# Patient Record
Sex: Female | Born: 1995
Health system: Southern US, Community
[De-identification: ages and names within clinical notes are randomized; demographics above are authoritative.]

## PROBLEM LIST (undated history)

## (undated) DIAGNOSIS — Z789 Other specified health status: Secondary | ICD-10-CM

## (undated) HISTORY — DX: Other specified health status: Z78.9

## (undated) HISTORY — PX: NO PAST SURGERIES: SHX2092

---

## 2020-02-07 ENCOUNTER — Ambulatory Visit: Payer: Self-pay | Attending: Internal Medicine

## 2020-02-07 DIAGNOSIS — Z23 Encounter for immunization: Secondary | ICD-10-CM | POA: Insufficient documentation

## 2020-02-07 NOTE — Progress Notes (Signed)
   Covid-19 Vaccination Clinic  Name:  Bushra Denman    MRN: [id number] DOB: [birth year]  02/07/2020  [name] was observed post Covid-19 immunization for 15 minutes without incidence. She was provided with Vaccine Information Sheet and instruction to access the V-Safe system.   Ms. Runion was instructed to call 911 with any severe reactions post vaccine: Marland Kitchen Difficulty breathing  . Swelling of your face and throat  . A fast heartbeat  . A bad rash all over your body  . Dizziness and weakness    Immunizations Administered    Name Date Dose VIS Date Route   Pfizer COVID-19 Vaccine 02/07/2020 12:04 PM 0.3 mL 11/29/2019 Intramuscular   Manufacturer: [employer], [employer]   Lot: [id number]   NDC: [address]-6

## 2020-03-03 ENCOUNTER — Ambulatory Visit: Payer: Self-pay | Attending: Internal Medicine

## 2020-03-03 DIAGNOSIS — Z23 Encounter for immunization: Secondary | ICD-10-CM

## 2020-03-03 NOTE — Progress Notes (Signed)
   Covid-19 Vaccination Clinic  Name:  Vicki Cline    MRN: [id number] DOB: 01/30/1996  03/03/2020  [name] was observed post Covid-19 immunization for 15 minutes without incident. She was provided with Vaccine Information Sheet and instruction to access the V-Safe system.   [name] was instructed to call 911 with any severe reactions post vaccine: Marland Kitchen Difficulty breathing  . Swelling of face and throat  . A fast heartbeat  . A bad rash all over body  . Dizziness and weakness   Immunizations Administered    Name Date Dose VIS Date Route   Pfizer COVID-19 Vaccine 03/03/2020  9:25 AM 0.3 mL 11/29/2019 Intramuscular   Manufacturer: [employer], [employer]   Lot: [id number]   NDC: [address]-1

## 2020-09-17 ENCOUNTER — Emergency Department (HOSPITAL_COMMUNITY): Payer: 59

## 2020-09-17 ENCOUNTER — Other Ambulatory Visit: Payer: Self-pay

## 2020-09-17 ENCOUNTER — Emergency Department (HOSPITAL_COMMUNITY)
Admission: EM | Admit: 2020-09-17 | Discharge: 2020-09-17 | Disposition: A | Discharge status: Home / Self Care | Payer: 59 | Attending: Emergency Medicine | Admitting: Emergency Medicine

## 2020-09-17 ENCOUNTER — Encounter (HOSPITAL_COMMUNITY): Payer: Self-pay

## 2020-09-17 DIAGNOSIS — R300 Dysuria: Secondary | ICD-10-CM | POA: Diagnosis not present

## 2020-09-17 DIAGNOSIS — R1030 Lower abdominal pain, unspecified: Secondary | ICD-10-CM

## 2020-09-17 DIAGNOSIS — R110 Nausea: Secondary | ICD-10-CM | POA: Insufficient documentation

## 2020-09-17 DIAGNOSIS — N898 Other specified noninflammatory disorders of vagina: Secondary | ICD-10-CM | POA: Diagnosis not present

## 2020-09-17 DIAGNOSIS — R102 Pelvic and perineal pain: Secondary | ICD-10-CM | POA: Insufficient documentation

## 2020-09-17 DIAGNOSIS — R1031 Right lower quadrant pain: Secondary | ICD-10-CM | POA: Insufficient documentation

## 2020-09-17 LAB — CBC WITH DIFFERENTIAL/PLATELET
Abs Immature Granulocytes: 0.02 10*3/uL (ref 0.00–0.07)
Basophils Absolute: 0 10*3/uL (ref 0.0–0.1)
Basophils Relative: 1 %
Eosinophils Absolute: 0.1 10*3/uL (ref 0.0–0.5)
Eosinophils Relative: 1 %
HCT: 36.9 % (ref 36.0–46.0)
Hemoglobin: 12.1 g/dL (ref 12.0–15.0)
Immature Granulocytes: 0 %
Lymphocytes Relative: 23 %
Lymphs Abs: 1.2 10*3/uL (ref 0.7–4.0)
MCH: 29.3 pg (ref 26.0–34.0)
MCHC: 32.8 g/dL (ref 30.0–36.0)
MCV: 89.3 fL (ref 80.0–100.0)
Monocytes Absolute: 0.5 10*3/uL (ref 0.1–1.0)
Monocytes Relative: 9 %
Neutro Abs: 3.4 10*3/uL (ref 1.7–7.7)
Neutrophils Relative %: 66 %
Platelets: 272 10*3/uL (ref 150–400)
RBC: 4.13 MIL/uL (ref 3.87–5.11)
RDW: 12.4 % (ref 11.5–15.5)
WBC: 5.1 10*3/uL (ref 4.0–10.5)
nRBC: 0 % (ref 0.0–0.2)

## 2020-09-17 LAB — COMPREHENSIVE METABOLIC PANEL
ALT: 11 U/L (ref 0–44)
AST: 17 U/L (ref 15–41)
Albumin: 3.9 g/dL (ref 3.5–5.0)
Alkaline Phosphatase: 60 U/L (ref 38–126)
Anion gap: 9 (ref 5–15)
BUN: 9 mg/dL (ref 6–20)
CO2: 26 mmol/L (ref 22–32)
Calcium: 9.3 mg/dL (ref 8.9–10.3)
Chloride: 103 mmol/L (ref 98–111)
Creatinine, Ser: 0.77 mg/dL (ref 0.44–1.00)
GFR calc Af Amer: >60 mL/min (ref >60)
GFR calc non Af Amer: >60 mL/min (ref >60)
Glucose, Bld: 84 mg/dL (ref 70–99)
Potassium: 3.7 mmol/L (ref 3.5–5.1)
Sodium: 138 mmol/L (ref 135–145)
Total Bilirubin: 0.6 mg/dL (ref 0.3–1.2)
Total Protein: 7.4 g/dL (ref 6.5–8.1)

## 2020-09-17 LAB — URINALYSIS, ROUTINE W REFLEX MICROSCOPIC
Bilirubin Urine: NEGATIVE
Glucose, UA: NEGATIVE mg/dL
Ketones, ur: 20 mg/dL — AB
Leukocytes,Ua: NEGATIVE
Nitrite: NEGATIVE
Protein, ur: NEGATIVE mg/dL
Specific Gravity, Urine: 1.024 (ref 1.005–1.030)
pH: 5 (ref 5.0–8.0)

## 2020-09-17 LAB — I-STAT BETA HCG BLOOD, ED (MC, WL, AP ONLY): I-stat hCG, quantitative: <5 m[IU]/mL (ref <5)

## 2020-09-17 LAB — LIPASE, BLOOD: Lipase: 27 U/L (ref 11–51)

## 2020-09-17 MED ORDER — MORPHINE SULFATE (PF) 4 MG/ML IV SOLN
4.0000 mg | Freq: Once | INTRAVENOUS | Status: DC
  Filled 2020-09-17: qty 1

## 2020-09-17 MED ORDER — SODIUM CHLORIDE 0.9 % IV BOLUS
500.0000 mL | Freq: Once | INTRAVENOUS | Status: AC
  Administered 2020-09-17: 500 mL via INTRAVENOUS

## 2020-09-17 MED ORDER — ACETAMINOPHEN 325 MG PO TABS
650.0000 mg | ORAL_TABLET | Freq: Once | ORAL | Status: AC
  Administered 2020-09-17: 650 mg via ORAL
  Filled 2020-09-17: qty 2

## 2020-09-17 NOTE — Discharge Instructions (Addendum)
At this time there does not appear to be the presence of an emergent medical condition, however there is always the potential for conditions to change. Please read and follow the below instructions.  Please return to the Emergency Department immediately for any new or worsening symptoms. Please be sure to follow up with your Primary Care Provider within one week regarding your visit today; please call their office to schedule an appointment even if you are feeling better for a follow-up visit. Your ultrasound today showed a complex left ovarian cyst as well as some free pelvic fluid.  Please follow-up with your OB/GYN and your primary care doctor this week to discuss those results and to see if any follow-up imaging will be needed.  If you do not have an OB/GYN please call the women's health center under discharge paperwork to establish one. As we discussed her examination was limited today as he did not want pelvic examination.  Please be sure to follow-up with your OB/GYN for a pelvic exam or return to the ER for any new or worsening symptoms.  Go to the nearest Emergency Department immediately if: You have fever or chills You vomit. Your pain is only in areas of your belly, such as the right side or the left lower part of the belly. You have bloody or black poop, or poop that looks like tar. You have very bad pain, cramping, or bloating in your belly. You have signs of not having enough fluid or water in your body (dehydration), such as: Dark pee, very little pee, or no pee. Cracked lips. Dry mouth. Sunken eyes. Sleepiness. Weakness. You have trouble breathing or chest pain. You have any new/concerning or worsening of symptoms   Please read the additional information packets attached to your discharge summary.  Do not take your medicine if  develop an itchy rash, swelling in your mouth or lips, or difficulty breathing; call 911 and seek immediate emergency medical attention if this  occurs.  You may review your lab tests and imaging results in their entirety on your MyChart account.  Please discuss all results of fully with your primary care provider and other specialist at your follow-up visit.  Note: Portions of this text may have been transcribed using voice recognition software. Every effort was made to ensure accuracy; however, inadvertent computerized transcription errors may still be present.

## 2020-09-17 NOTE — ED Triage Notes (Signed)
Patient c/o right flank pain,  mid lower abdominal pain and dysuria x 2-3 days. Patient states she went to an UC and "everything came out clear."

## 2020-09-17 NOTE — ED Provider Notes (Signed)
[facility]-EMERGENCY DEPT Provider Note   CSN: [id number] Arrival date & time: 09/17/20  2130     History Chief Complaint  Patient presents with  . Flank Pain  . Dysuria    Vicki Cline is a 24 y.o. female otherwise healthy no daily medication use.  Patient presents today for abdominal pain.  She reports around a week ago she developed some vaginal discharge and pelvic pain, worsening 3 days ago intermittent crampy sensation constant nonradiating no clear aggravating or alleviating factors.  She reports she had some mild dysuria today her symptoms began.  She reports she went to an urgent care shortly after symptom onset and at that time she had a clear urinalysis.  She reports they also did a pelvic examination which showed bacterial vaginosis but her STI testing was negative and she denies any pain during her pelvic exam.  She went home and pain worsened since last night.  She reports pain is now radiates in the right lower quadrant and to her back, constant moderate intensity no clear aggravating or alleviating factors.  Associated symptom nausea.  Denies fever/chills, headache, chest pain/shortness of breath, vomiting, diarrhea, hematuria, vaginal bleeding/discharge, concern for STI or any additional concerns.  HPI     History reviewed. No pertinent past medical history.  There are no problems to display for this patient.   History reviewed. No pertinent surgical history.   OB History   No obstetric history on file.     Family History  Family history unknown: Yes    Social History   Tobacco Use  . Smoking status: Never Smoker  . Smokeless tobacco: Never Used  Vaping Use  . Vaping Use: Never used  Substance Use Topics  . Alcohol use: Yes  . Drug use: Never    Home Medications Prior to Admission medications   Medication Sig Start Date End Date Taking? Authorizing Provider  spironolactone (ALDACTONE) 50 MG tablet Take 50 mg by  mouth 2 (two) times daily. 09/14/20  Yes [provider]    Allergies    Patient has no known allergies.  Review of Systems   Review of Systems Ten systems are reviewed and are negative for acute change except as noted in the HPI  Physical Exam Updated Vital Signs BP 103/73   Pulse 77   Temp 98.2 F (36.8 C) (Oral)   Resp 18   Ht 5\' 5"  (1.651 m)   Wt 69.4 kg   LMP 08/20/2020   SpO2 100%   BMI 25.46 kg/m   Physical Exam Constitutional:      General: She is not in acute distress.    Appearance: Normal appearance. She is well-developed. She is not ill-appearing or diaphoretic.  HENT:     Head: Normocephalic and atraumatic.  Eyes:     General: Vision grossly intact. Gaze aligned appropriately.     Pupils: Pupils are equal, round, and reactive to light.  Neck:     Trachea: Trachea and phonation normal.  Pulmonary:     Effort: Pulmonary effort is normal. No respiratory distress.  Abdominal:     General: There is no distension.     Palpations: Abdomen is soft.     Tenderness: There is abdominal tenderness in the right lower quadrant, suprapubic area and left lower quadrant. There is no guarding or rebound.  Musculoskeletal:        General: Normal range of motion.     Cervical back: Normal range of motion.  Comments: No midline C/T/L spinal tenderness to palpation, no deformity, crepitus, or step-off noted. No sign of injury to the neck or back.  Skin:    General: Skin is warm and dry.  Neurological:     Mental Status: She is alert.     GCS: GCS eye subscore is 4. GCS verbal subscore is 5. GCS motor subscore is 6.     Comments: Speech is clear and goal oriented, follows commands Major Cranial nerves without deficit, no facial droop Moves extremities without ataxia, coordination intact  Psychiatric:        Behavior: Behavior normal.     ED Results / Procedures / Treatments   Labs (all labs ordered are listed, but only abnormal results are displayed) Labs  Reviewed  URINALYSIS, ROUTINE W REFLEX MICROSCOPIC - Abnormal; Notable for the following components:      Result Value   APPearance HAZY (*)    Hgb urine dipstick SMALL (*)    Ketones, ur 20 (*)    Bacteria, UA FEW (*)    All other components within normal limits  CBC WITH DIFFERENTIAL/PLATELET  LIPASE, BLOOD  COMPREHENSIVE METABOLIC PANEL  I-STAT BETA HCG BLOOD, ED ([facility], [facility], [facility] ONLY)    EKG None  Radiology US Transvaginal Non-OB  Result Date: 09/17/2020 CLINICAL DATA:  Pelvic pain. EXAM: TRANSABDOMINAL AND TRANSVAGINAL ULTRASOUND OF PELVIS DOPPLER ULTRASOUND OF OVARIES TECHNIQUE: Both transabdominal and transvaginal ultrasound examinations of the pelvis were performed. Transabdominal technique was performed for global imaging of the pelvis including uterus, ovaries, adnexal regions, and pelvic cul-de-sac. It was necessary to proceed with endovaginal exam following the transabdominal exam to visualize the uterus, endometrium, bilateral ovaries and bilateral adnexa. Color and duplex Doppler ultrasound was utilized to evaluate blood flow to the ovaries. COMPARISON:  None. FINDINGS: Uterus Measurements: 6.5 cm x 2.9 cm x 3.7 cm = volume: 36.8 mL. No fibroids or other mass visualized. Endometrium Thickness: 6.3 mm.  No focal abnormality visualized. Right ovary Measurements: 3.4 cm x 2.4 cm x 3.4 cm = volume: 14.7 mL. Normal appearance/no adnexal mass. Left ovary Measurements: 4.5 cm x 2.7 cm x 4.4 cm = volume: 28.4 mL. A 2.2 cm x 2.1 cm x 2.3 cm complex anechoic structure is seen within the left ovary. No flow is seen within this region on color Doppler evaluation. Pulsed Doppler evaluation of both ovaries demonstrates normal low-resistance arterial and venous waveforms. Other findings A moderate amount of pelvic free fluid is seen IMPRESSION: 1. Complex left ovarian cyst. 2. Moderate amount of pelvic free fluid. Electronically Signed   By: Aram Candela M.D.   On: 09/17/2020 16:30   US Pelvis  Complete  Result Date: 09/17/2020 CLINICAL DATA:  Pelvic pain. EXAM: TRANSABDOMINAL AND TRANSVAGINAL ULTRASOUND OF PELVIS DOPPLER ULTRASOUND OF OVARIES TECHNIQUE: Both transabdominal and transvaginal ultrasound examinations of the pelvis were performed. Transabdominal technique was performed for global imaging of the pelvis including uterus, ovaries, adnexal regions, and pelvic cul-de-sac. It was necessary to proceed with endovaginal exam following the transabdominal exam to visualize the uterus, endometrium, bilateral ovaries and bilateral adnexa. Color and duplex Doppler ultrasound was utilized to evaluate blood flow to the ovaries. COMPARISON:  None. FINDINGS: Uterus Measurements: 6.5 cm x 2.9 cm x 3.7 cm = volume: 36.8 mL. No fibroids or other mass visualized. Endometrium Thickness: 6.3 mm.  No focal abnormality visualized. Right ovary Measurements: 3.4 cm x 2.4 cm x 3.4 cm = volume: 14.7 mL. Normal appearance/no adnexal mass. Left ovary Measurements: 4.5 cm  x 2.7 cm x 4.4 cm = volume: 28.4 mL. A 2.2 cm x 2.1 cm x 2.3 cm complex anechoic structure is seen within the left ovary. No flow is seen within this region on color Doppler evaluation. Pulsed Doppler evaluation of both ovaries demonstrates normal low-resistance arterial and venous waveforms. Other findings A moderate amount of pelvic free fluid is seen IMPRESSION: 1. Complex left ovarian cyst. 2. Moderate amount of pelvic free fluid. Electronically Signed   By: Aram Candelahaddeus  Houston M.D.   On: 09/17/2020 16:28   US Art/Ven Flow Abd Pelv Doppler  Result Date: 09/17/2020 CLINICAL DATA:  Pelvic pain. EXAM: TRANSABDOMINAL AND TRANSVAGINAL ULTRASOUND OF PELVIS DOPPLER ULTRASOUND OF OVARIES TECHNIQUE: Both transabdominal and transvaginal ultrasound examinations of the pelvis were performed. Transabdominal technique was performed for global imaging of the pelvis including uterus, ovaries, adnexal regions, and pelvic cul-de-sac. It was necessary to proceed with  endovaginal exam following the transabdominal exam to visualize the uterus, endometrium, bilateral ovaries and bilateral adnexa. Color and duplex Doppler ultrasound was utilized to evaluate blood flow to the ovaries. COMPARISON:  None. FINDINGS: Uterus Measurements: 6.5 cm x 2.9 cm x 3.7 cm = volume: 36.8 mL. No fibroids or other mass visualized. Endometrium Thickness: 6.3 mm.  No focal abnormality visualized. Right ovary Measurements: 3.4 cm x 2.4 cm x 3.4 cm = volume: 14.7 mL. Normal appearance/no adnexal mass. Left ovary Measurements: 4.5 cm x 2.7 cm x 4.4 cm = volume: 28.4 mL. A 2.2 cm x 2.1 cm x 2.3 cm complex anechoic structure is seen within the left ovary. No flow is seen within this region on color Doppler evaluation. Pulsed Doppler evaluation of both ovaries demonstrates normal low-resistance arterial and venous waveforms. Other findings A moderate amount of pelvic free fluid is seen IMPRESSION: 1. Complex left ovarian cyst. 2. Moderate amount of pelvic free fluid. Electronically Signed   By: Aram Candelahaddeus  Houston M.D.   On: 09/17/2020 16:30    Procedures Procedures (including critical care time)  Medications Ordered in ED Medications  sodium chloride 0.9 % bolus 500 mL (500 mLs Intravenous New Bag/Given 09/17/20 1315)  acetaminophen (TYLENOL) tablet 650 mg (650 mg Oral Given 09/17/20 1417)    ED Course  I have reviewed the triage vital signs and the nursing notes.  Pertinent labs & imaging results that were available during my care of the patient were reviewed by me and considered in my medical decision making (see chart for details).    MDM Rules/Calculators/A&P                         Additional history obtained from: 1. Nursing notes from this visit. 2. Electronic medical record reviewed.  Patient had urgent care visit on 09/11/2020.  From their note it appears patient was having vaginal discharge for around 10 days.  UA showed blood and protein.  Pregnancy test was negative.   Gonorrhea/chlamydia/trichomonas testing negative.  Vaginal probe shows Gardnerella vaginalis.  Patient was treated with Flagyl gel. ---- I reviewed and interpreted labs which include: Negative pregnancy test. CMP within normal limits, no emergent electrolyte derangement, AKI, LFT elevations or gap. Lipase within normal limits. CBC within normal limits, no anemia or leukocytosis. Urinalysis shows small hemoglobin, few ketones few bacteria and 6-10 squamous cells.  Not consistent with UTI appears contaminated. - Patient was reassessed resting comfortably no acute distress, she states understanding of findings as above.  Patient refused pelvic examination today stating that she just had one  a few days ago which was negative for evidence of STI.  I have low suspicion for PID as cause of patient's symptoms today however ovarian etiology is still a possibility.  Patient wishes to proceed with pelvic ultrasound for assessment of possible ovarian cyst/torsion and refuses pelvic exam.  She states understanding of limitations of work-up today without pelvic exam. - Pelvic US w/ Doppler:  IMPRESSION:  1. Complex left ovarian cyst.  2. Moderate amount of pelvic free fluid.  - Risks versus benefits of CT imaging of the abdomen/pelvis were discussed with patient and patient chose to defer CT scan today.  I feel this was appropriate due to low suspicion of intra-abdominal infection based on work-up.  Low suspicion for appendicitis, additionally low suspicion for cholecystitis, SBO, perforation, pyelonephritis or other emergent pathologies at this time.  Patient will be discharged with OTC anti-inflammatories, hydration and PCP and OBGYN follow-up.  On reassessment she is sleeping comfortably in bed no acute distress easily arousable to voice, vital signs stable.  She states understanding of care plan has no further questions or concerns.  At this time there does not appear to be any evidence of an acute emergency  medical condition and the patient appears stable for discharge with appropriate outpatient follow up. Diagnosis was discussed with patient who verbalizes understanding of care plan and is agreeable to discharge. I have discussed return precautions with patient  who verbalizes understanding. Patient encouraged to follow-up with their PCP. All questions answered.  Patient's case discussed with Dr. Madilyn Hook who agrees with plan to discharge with follow-up.   Note: Portions of this report may have been transcribed using voice recognition software. Every effort was made to ensure accuracy; however, inadvertent computerized transcription errors may still be present. Final Clinical Impression(s) / ED Diagnoses Final diagnoses:  Lower abdominal pain    Rx / DC Orders ED Discharge Orders    None       Elizabeth Palau 09/17/20 1712    Tilden Fossa, MD 09/18/20 [phone]

## 2020-11-05 ENCOUNTER — Ambulatory Visit (INDEPENDENT_AMBULATORY_CARE_PROVIDER_SITE_OTHER): Payer: 59 | Admitting: Obstetrics and Gynecology

## 2020-11-05 ENCOUNTER — Encounter: Payer: Self-pay | Admitting: Obstetrics and Gynecology

## 2020-11-05 ENCOUNTER — Other Ambulatory Visit (HOSPITAL_COMMUNITY)
Admission: RE | Admit: 2020-11-05 | Discharge: 2020-11-05 | Disposition: A | Discharge status: Home / Self Care | Payer: 59 | Source: Ambulatory Visit | Attending: Obstetrics and Gynecology | Admitting: Obstetrics and Gynecology

## 2020-11-05 ENCOUNTER — Other Ambulatory Visit: Payer: Self-pay

## 2020-11-05 VITALS — BP 109/72 | HR 88 | Wt 157.4 lb

## 2020-11-05 DIAGNOSIS — N761 Subacute and chronic vaginitis: Secondary | ICD-10-CM | POA: Diagnosis not present

## 2020-11-05 DIAGNOSIS — Z01419 Encounter for gynecological examination (general) (routine) without abnormal findings: Secondary | ICD-10-CM

## 2020-11-05 DIAGNOSIS — N83202 Unspecified ovarian cyst, left side: Secondary | ICD-10-CM | POA: Diagnosis not present

## 2020-11-05 NOTE — Progress Notes (Signed)
Called pt at 1615; [place] appt information given.   Fleet Contras RN 11/05/20

## 2020-11-05 NOTE — Progress Notes (Addendum)
[place] scheduled for 12/09/20 at 1000; pt to arrive at 0945. Called pt after visit today with appt time; VM has not been set up. Pt does not have MyChart. Will attempt to contact pt a second time.   Fleet Contras RN 11/05/20

## 2020-11-05 NOTE — Progress Notes (Signed)
Subjective:     Vicki Cline is a 24 y.o. female P0 who is here for a comprehensive physical exam. The patient reports recent ED visit in September and diagnosis of left ovarian cyst. Patient reports intermittent left lower quadrant pain since but overall some improvement. She reports a monthly period lasting 4-6 days. She is sexually active using vasectomy for contraception. She denies any abnormal discharge. Patient is interested in STD screnning for chlamydia and gonorrhea. Patient reports recurrent yeast and BV infection as well. She also reports strong odor in her urine and desires testing  No past medical history on file. No past surgical history on file. Family History  Family history unknown: Yes    Social History   Socioeconomic History   Marital status: Single    Spouse name: Not on file   Number of children: Not on file   Years of education: Not on file   Highest education level: Not on file  Occupational History   Not on file  Tobacco Use   Smoking status: Never Smoker   Smokeless tobacco: Never Used  Vaping Use   Vaping Use: Never used  Substance and Sexual Activity   Alcohol use: Yes    Comment: occasional   Drug use: Never   Sexual activity: Yes    Partners: Male    Birth control/protection: Surgical    Comment: current parter has vasectomy  Other Topics Concern   Not on file  Social History Narrative   Not on file   Social Determinants of Health   Financial Resource Strain:    Difficulty of Paying Living Expenses: Not on file  Food Insecurity:    Worried About Programme researcher, broadcasting/film/video in the Last Year: Not on file   [employer] of Food in the Last Year: Not on file  Transportation Needs:    Lack of Transportation (Medical): Not on file   Lack of Transportation (Non-Medical): Not on file  Physical Activity:    Days of Exercise per Week: Not on file   Minutes of Exercise per Session: Not on file  Stress:    Feeling of Stress  : Not on file  Social Connections:    Frequency of Communication with Friends and Family: Not on file   Frequency of Social Gatherings with Friends and Family: Not on file   Attends Religious Services: Not on file   Active Member of Clubs or Organizations: Not on file   Attends Banker Meetings: Not on file   Marital Status: Not on file  Intimate Partner Violence:    Fear of Current or Ex-Partner: Not on file   Emotionally Abused: Not on file   Physically Abused: Not on file   Sexually Abused: Not on file   Health Maintenance  Topic Date Due   Hepatitis C Screening  Never done   HIV Screening  Never done   TETANUS/TDAP  Never done   PAP-Cervical Cytology Screening  Never done   PAP SMEAR-Modifier  Never done   INFLUENZA VACCINE  03/18/2021 (Originally 07/19/2020)   COVID-19 Vaccine  Completed       Review of Systems Pertinent items noted in HPI and remainder of comprehensive ROS otherwise negative.   Objective:  Blood pressure 109/72, pulse 88, weight 157 lb 6.4 oz (71.4 kg), last menstrual period 10/23/2020.     GENERAL: Well-developed, well-nourished female in no acute distress.  HEENT: Normocephalic, atraumatic. Sclerae anicteric.  NECK: Supple. Normal thyroid.  LUNGS: Clear to auscultation bilaterally.  HEART: Regular rate and rhythm. BREASTS: Symmetric in size. No palpable masses or lymphadenopathy, skin changes, or nipple drainage. ABDOMEN: Soft, nontender, nondistended. No organomegaly. PELVIC: Normal external female genitalia. Vagina is pink and rugated.  Normal discharge. Normal appearing cervix. Uterus is normal in size. No adnexal mass or tenderness. EXTREMITIES: No cyanosis, clubbing, or edema, 2+ distal pulses.  09/17/20 ultrasound FINDINGS: Uterus  Measurements: 6.5 cm x 2.9 cm x 3.7 cm = volume: 36.8 mL. No fibroids or other mass visualized.  Endometrium  Thickness: 6.3 mm.  No focal abnormality visualized.  Right  ovary  Measurements: 3.4 cm x 2.4 cm x 3.4 cm = volume: 14.7 mL. Normal appearance/no adnexal mass.  Left ovary  Measurements: 4.5 cm x 2.7 cm x 4.4 cm = volume: 28.4 mL. A 2.2 cm x 2.1 cm x 2.3 cm complex anechoic structure is seen within the left ovary. No flow is seen within this region on color Doppler evaluation.  Pulsed Doppler evaluation of both ovaries demonstrates normal low-resistance arterial and venous waveforms.  Other findings  A moderate amount of pelvic free fluid is seen  IMPRESSION: 1. Complex left ovarian cyst. 2. Moderate amount of pelvic free fluid.   Electronically Signed   By: Aram Candela M.D.   On: 09/17/2020 16:30  Assessment:    Healthy female exam.      Plan:    Pap smear collected Vaginal swab collected Patient declined blood work for HIV, Hep, syphillis Urine culture collected Pelvic ultrasound ordered to follow up on left ovarian cyst Patient will be contacted with results See After Visit Summary for Counseling Recommendations

## 2020-11-06 LAB — CERVICOVAGINAL ANCILLARY ONLY
Bacterial Vaginitis (gardnerella): NEGATIVE
Candida Glabrata: NEGATIVE
Candida Vaginitis: NEGATIVE
Chlamydia: NEGATIVE
Comment: NEGATIVE
Comment: NEGATIVE
Comment: NEGATIVE
Comment: NEGATIVE
Comment: NEGATIVE
Comment: NORMAL
Neisseria Gonorrhea: NEGATIVE
Trichomonas: NEGATIVE

## 2020-11-09 LAB — URINE CULTURE

## 2020-11-10 MED ORDER — NITROFURANTOIN MONOHYD MACRO 100 MG PO CAPS: 100.0000 mg | ORAL_CAPSULE | Freq: Two times a day (BID) | ORAL | 1 refills | Status: DC

## 2020-11-10 NOTE — Addendum Note (Signed)
Addended by: Catalina Antigua on: 11/10/2020 08:19 AM   Modules accepted: Orders

## 2020-11-11 LAB — CYTOLOGY - PAP
Comment: NEGATIVE
Diagnosis: UNDETERMINED — AB
High risk HPV: NEGATIVE

## 2020-12-09 ENCOUNTER — Ambulatory Visit
Admission: RE | Admit: 2020-12-09 | Discharge: 2020-12-09 | Disposition: A | Discharge status: Home / Self Care | Payer: 59 | Source: Ambulatory Visit | Attending: Obstetrics and Gynecology | Admitting: Obstetrics and Gynecology

## 2020-12-09 ENCOUNTER — Other Ambulatory Visit: Payer: Self-pay

## 2020-12-09 DIAGNOSIS — N83202 Unspecified ovarian cyst, left side: Secondary | ICD-10-CM | POA: Insufficient documentation

## 2021-01-20 ENCOUNTER — Other Ambulatory Visit (HOSPITAL_COMMUNITY)
Admission: RE | Admit: 2021-01-20 | Discharge: 2021-01-20 | Disposition: A | Discharge status: Home / Self Care | Payer: 59 | Source: Ambulatory Visit | Attending: Family Medicine | Admitting: Family Medicine

## 2021-01-20 ENCOUNTER — Ambulatory Visit (INDEPENDENT_AMBULATORY_CARE_PROVIDER_SITE_OTHER): Payer: 59

## 2021-01-20 ENCOUNTER — Other Ambulatory Visit: Payer: Self-pay

## 2021-01-20 VITALS — BP 111/68 | HR 89

## 2021-01-20 DIAGNOSIS — N898 Other specified noninflammatory disorders of vagina: Secondary | ICD-10-CM

## 2021-01-20 NOTE — Progress Notes (Signed)
Chart reviewed for nurse visit. Agree with plan of care.   Karl Knarr M, MD 01/20/21 12:24 PM 

## 2021-01-20 NOTE — Progress Notes (Signed)
Pt here today for vaginal discharge.  Pt reports that the discharge is white with no odor and some itchiness. Pt explained how to obtain self swab and that we will call with abnormal results in 24-48 hrs.  Pt verbalized understanding.    Addison Naegeli, RN  01/20/21

## 2021-01-21 ENCOUNTER — Other Ambulatory Visit: Payer: Self-pay | Admitting: Family Medicine

## 2021-01-21 LAB — CERVICOVAGINAL ANCILLARY ONLY
Bacterial Vaginitis (gardnerella): POSITIVE — AB
Candida Glabrata: NEGATIVE
Candida Vaginitis: NEGATIVE
Chlamydia: NEGATIVE
Comment: NEGATIVE
Comment: NEGATIVE
Comment: NEGATIVE
Comment: NEGATIVE
Comment: NEGATIVE
Comment: NORMAL
Neisseria Gonorrhea: NEGATIVE
Trichomonas: NEGATIVE

## 2021-01-21 MED ORDER — METRONIDAZOLE 500 MG PO TABS: 500.0000 mg | ORAL_TABLET | Freq: Two times a day (BID) | ORAL | 0 refills | Status: AC

## 2021-04-22 DIAGNOSIS — N898 Other specified noninflammatory disorders of vagina: Secondary | ICD-10-CM | POA: Diagnosis not present

## 2021-05-26 DIAGNOSIS — Z01419 Encounter for gynecological examination (general) (routine) without abnormal findings: Secondary | ICD-10-CM | POA: Diagnosis not present

## 2021-05-26 DIAGNOSIS — N760 Acute vaginitis: Secondary | ICD-10-CM | POA: Diagnosis not present

## 2021-05-26 DIAGNOSIS — N390 Urinary tract infection, site not specified: Secondary | ICD-10-CM | POA: Diagnosis not present

## 2021-05-26 DIAGNOSIS — Z113 Encounter for screening for infections with a predominantly sexual mode of transmission: Secondary | ICD-10-CM | POA: Diagnosis not present

## 2021-05-26 DIAGNOSIS — Z6824 Body mass index (BMI) 24.0-24.9, adult: Secondary | ICD-10-CM | POA: Diagnosis not present

## 2021-09-03 DIAGNOSIS — H5203 Hypermetropia, bilateral: Secondary | ICD-10-CM | POA: Diagnosis not present

## 2021-10-11 DIAGNOSIS — F419 Anxiety disorder, unspecified: Secondary | ICD-10-CM | POA: Diagnosis not present

## 2021-10-25 DIAGNOSIS — F419 Anxiety disorder, unspecified: Secondary | ICD-10-CM | POA: Diagnosis not present

## 2021-11-22 DIAGNOSIS — F419 Anxiety disorder, unspecified: Secondary | ICD-10-CM | POA: Diagnosis not present

## 2021-12-06 DIAGNOSIS — F419 Anxiety disorder, unspecified: Secondary | ICD-10-CM | POA: Diagnosis not present

## 2022-01-12 IMAGING — US US TRANSVAGINAL NON-OB
1 series · 13 of 25 positions shown · non-contrast
Comparison: None.

CLINICAL DATA: Pelvic pain.

EXAM:
TRANSABDOMINAL AND TRANSVAGINAL ULTRASOUND OF PELVIS
DOPPLER ULTRASOUND OF OVARIES
TECHNIQUE: Both transabdominal and transvaginal ultrasound examinations of the
pelvis were performed. Transabdominal technique was performed for
global imaging of the pelvis including uterus, ovaries, adnexal
regions, and pelvic cul-de-sac.
It was necessary to proceed with endovaginal exam following the
transabdominal exam to visualize the uterus, endometrium, bilateral
ovaries and bilateral adnexa. Color and duplex Doppler ultrasound
was utilized to evaluate blood flow to the ovaries.

[Series 1: us transvaginal non-ob · 13 of 81 slices shown]
[im 1/81]
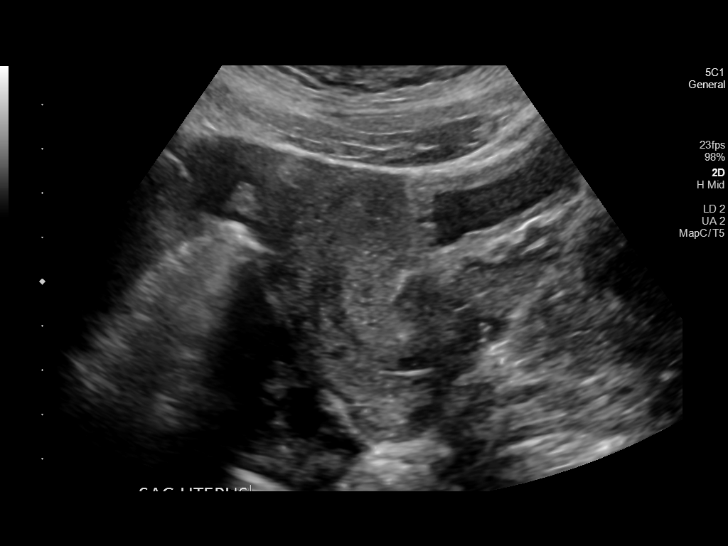
[im 7/81]
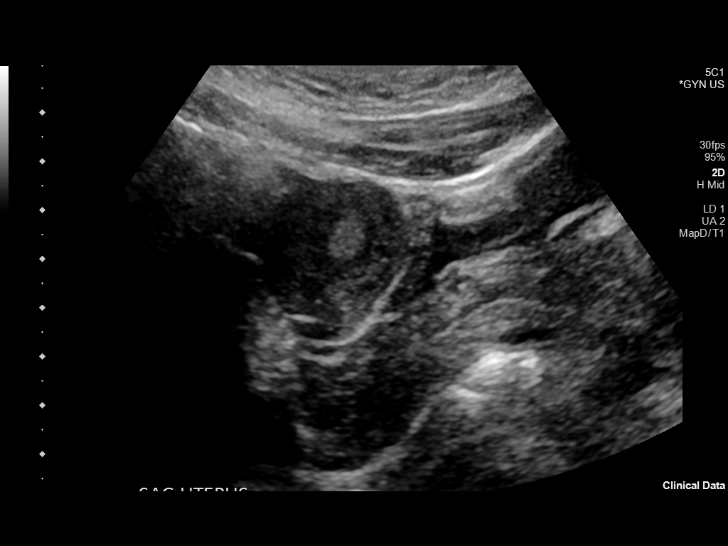
[im 14/81]
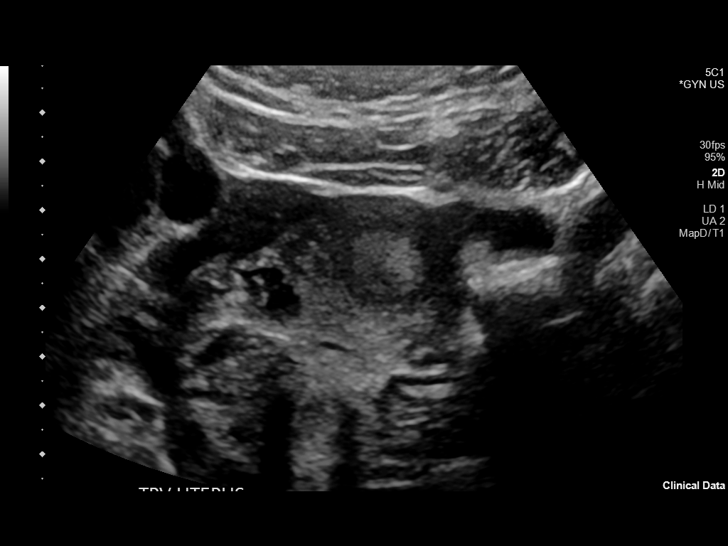
[im 21/81]
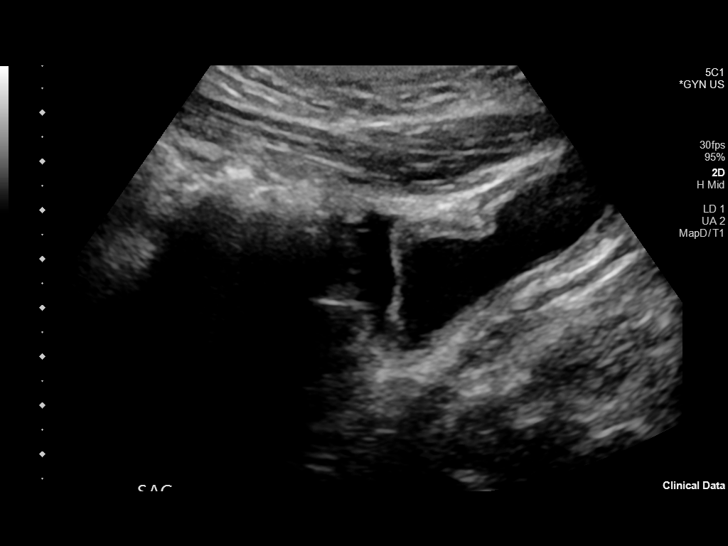
[im 27/81]
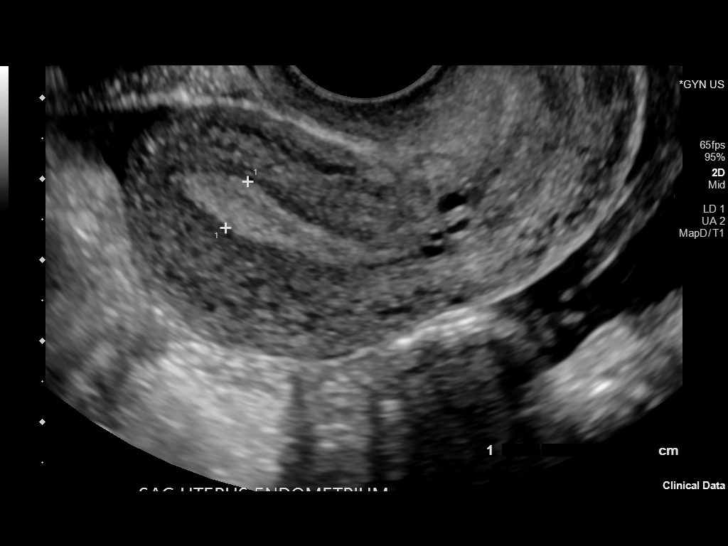
[im 34/81]
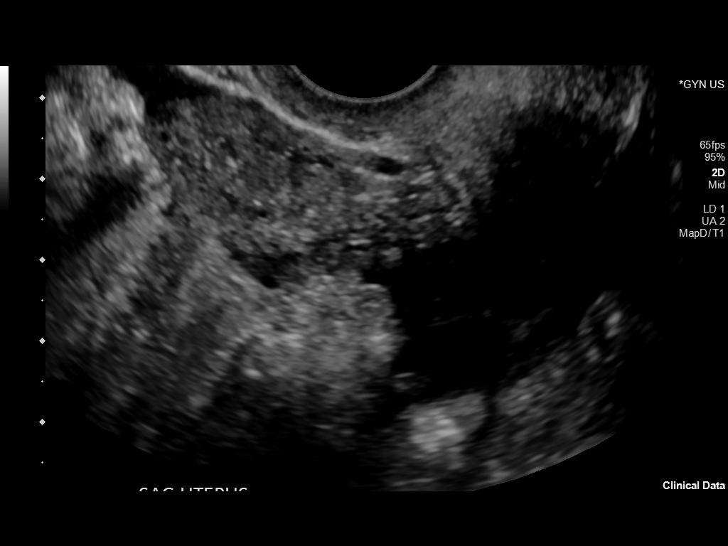
[im 41/81]
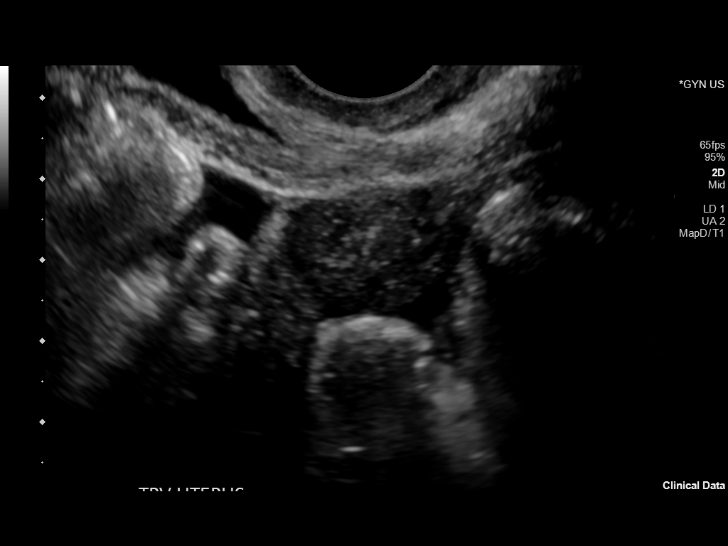
[im 47/81]
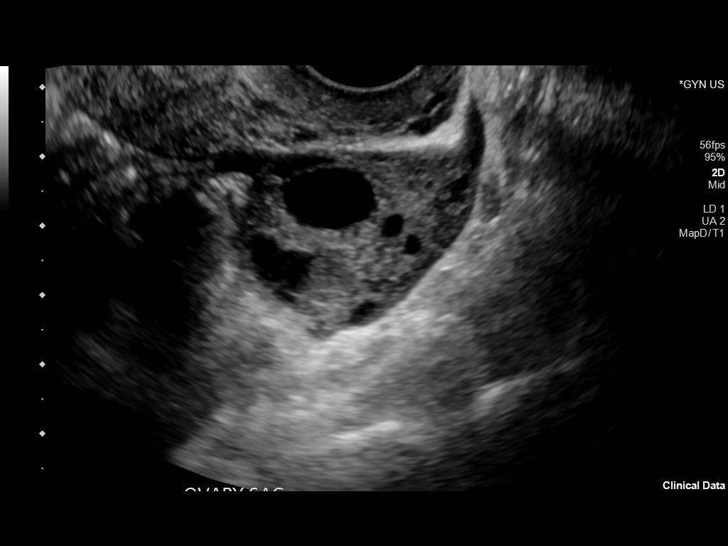
[im 54/81]
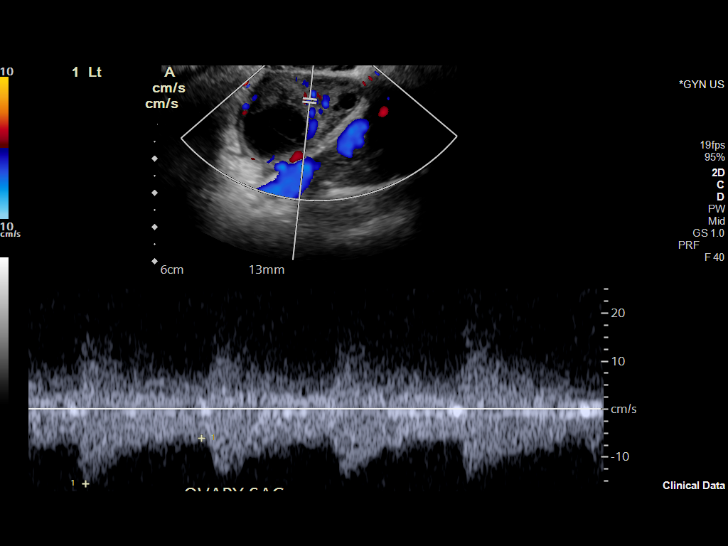
[im 61/81]
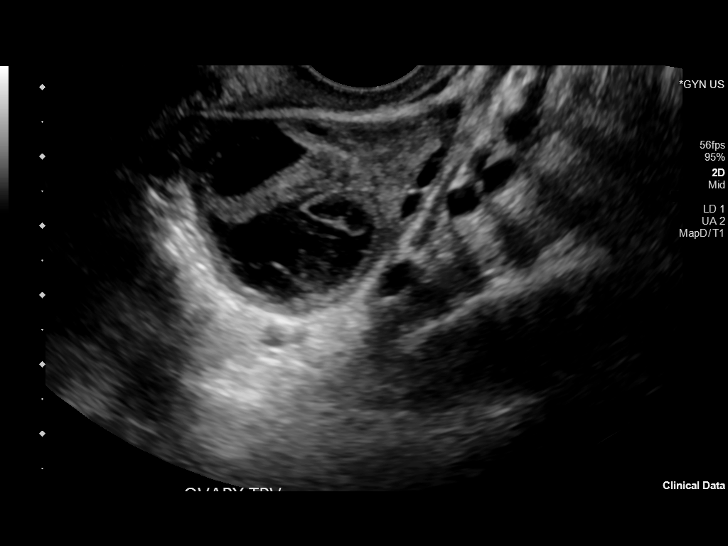
[im 67/81]
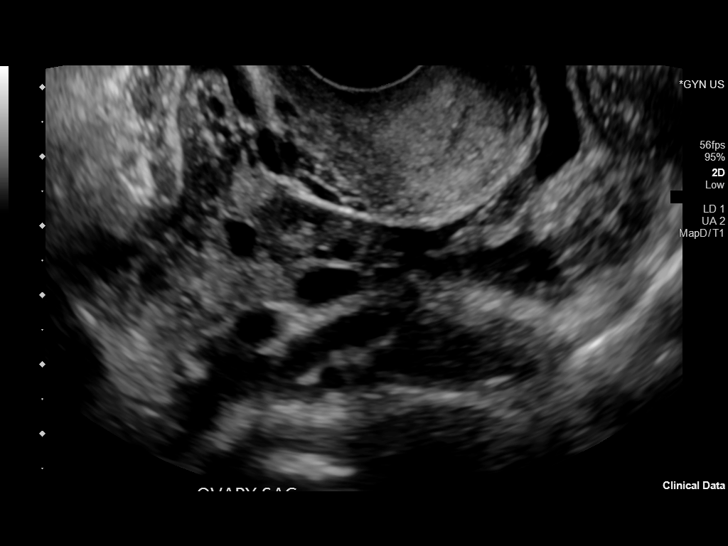
[im 74/81]
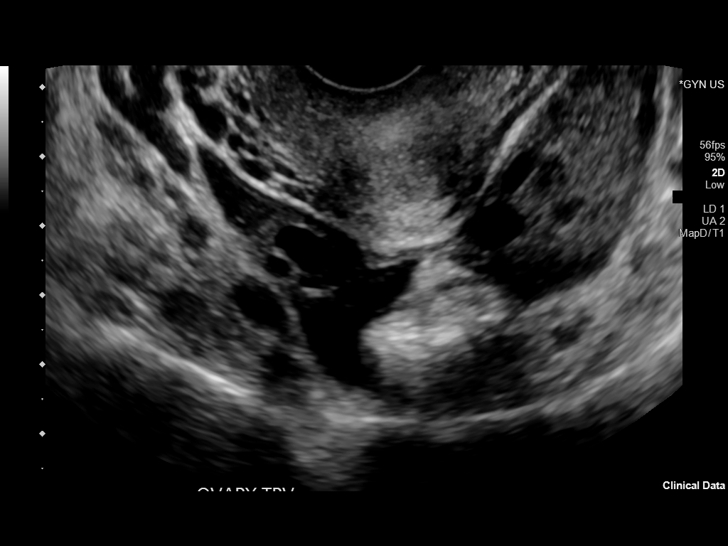
[im 81/81]
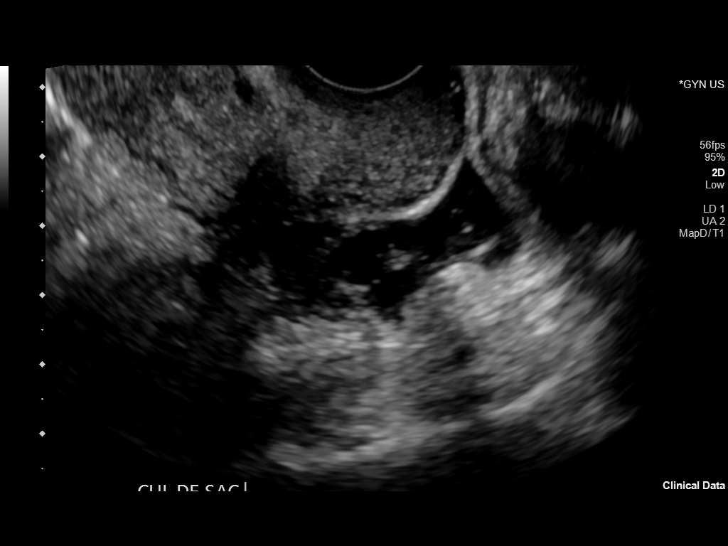

[13 of 25 positions shown; findings below may reference images not displayed]

FINDINGS: Uterus

Measurements: 6.5 cm x 2.9 cm x 3.7 cm = volume: 36.8 mL. No
fibroids or other mass visualized.

Endometrium

Thickness: 6.3 mm.  No focal abnormality visualized.

Right ovary

Measurements: 3.4 cm x 2.4 cm x 3.4 cm = volume: 14.7 mL. Normal
appearance/no adnexal mass.

Left ovary

Measurements: 4.5 cm x 2.7 cm x 4.4 cm = volume: 28.4 mL. A 2.2 cm x
2.1 cm x 2.3 cm complex anechoic structure is seen within the left
ovary. No flow is seen within this region on color Doppler
evaluation.

Pulsed Doppler evaluation of both ovaries demonstrates normal
low-resistance arterial and venous waveforms.

Other findings

A moderate amount of pelvic free fluid is seen
IMPRESSION: 1. Complex left ovarian cyst.
2. Moderate amount of pelvic free fluid.

## 2022-01-12 IMAGING — US US PELVIS COMPLETE
1 series · 13 of 25 positions shown · non-contrast
Comparison: None.

CLINICAL DATA: Pelvic pain.

EXAM:
TRANSABDOMINAL AND TRANSVAGINAL ULTRASOUND OF PELVIS
DOPPLER ULTRASOUND OF OVARIES
TECHNIQUE: Both transabdominal and transvaginal ultrasound examinations of the
pelvis were performed. Transabdominal technique was performed for
global imaging of the pelvis including uterus, ovaries, adnexal
regions, and pelvic cul-de-sac.
It was necessary to proceed with endovaginal exam following the
transabdominal exam to visualize the uterus, endometrium, bilateral
ovaries and bilateral adnexa. Color and duplex Doppler ultrasound
was utilized to evaluate blood flow to the ovaries.

[Series 1: us pelvis complete · 13 of 81 slices shown]
[im 1/81]
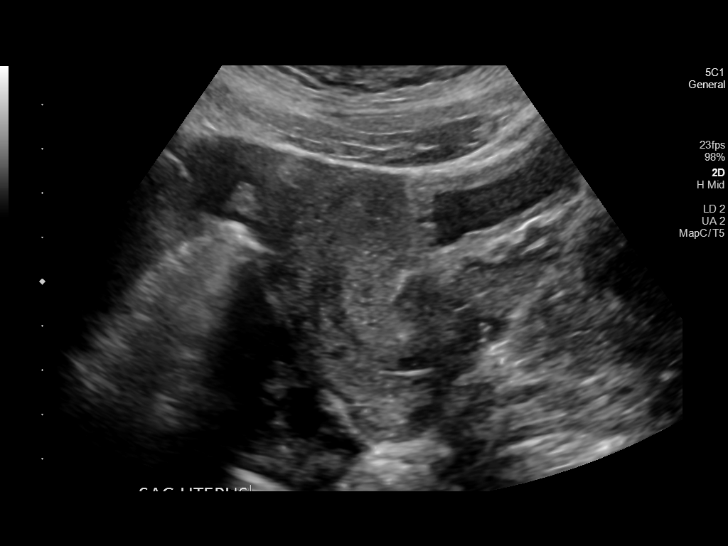
[im 7/81]
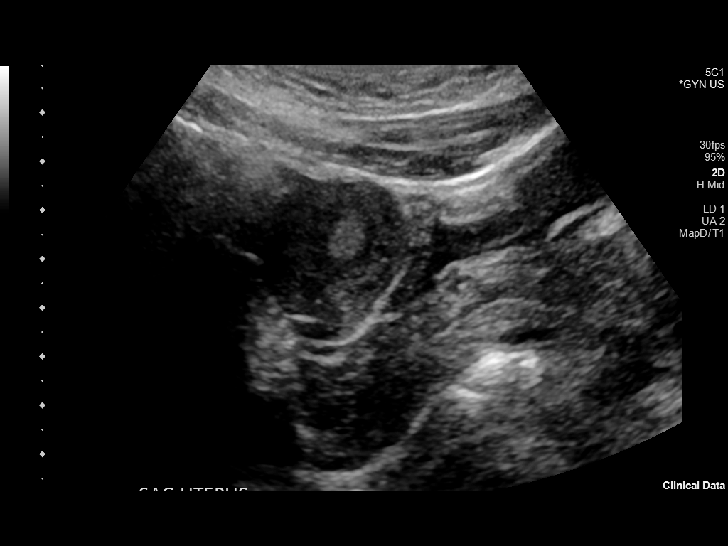
[im 14/81]
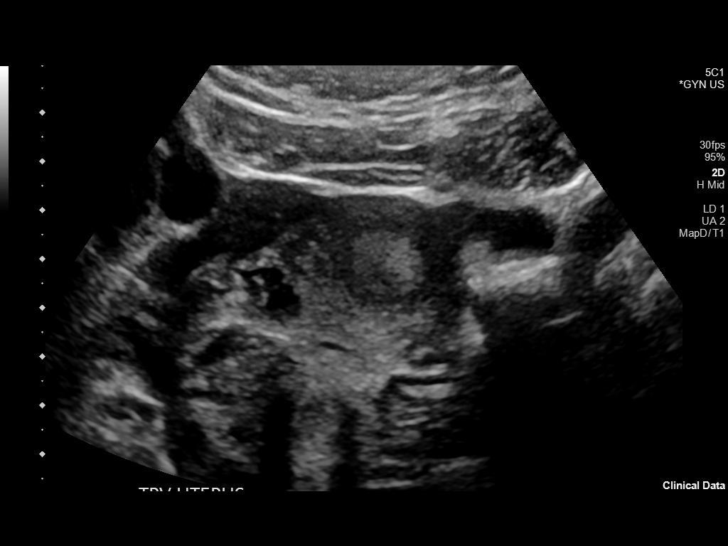
[im 21/81]
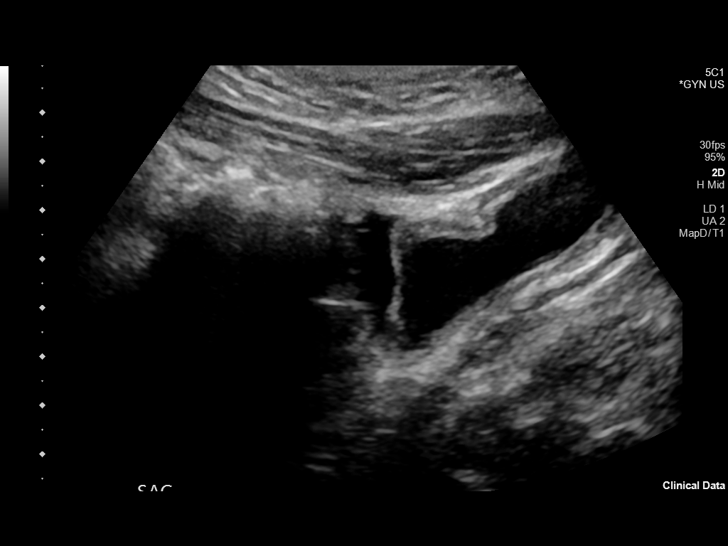
[im 27/81]
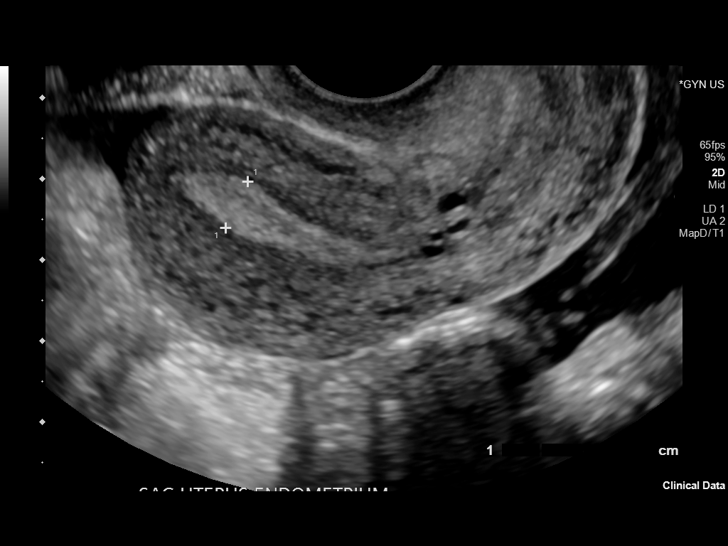
[im 34/81]
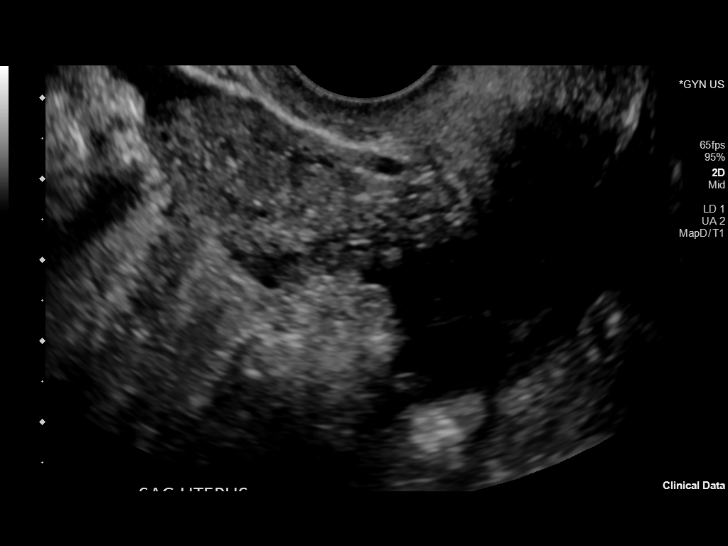
[im 41/81]
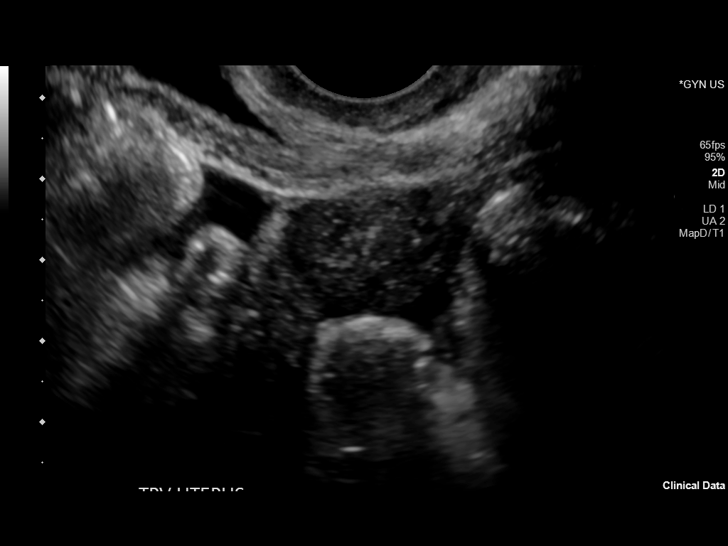
[im 47/81]
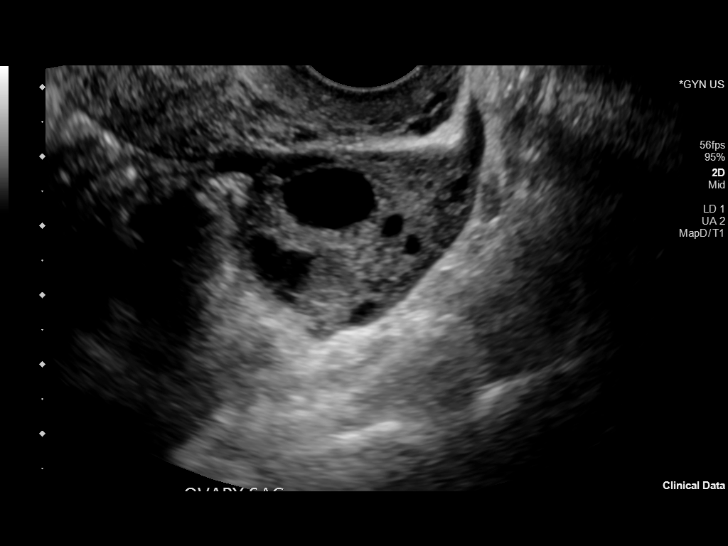
[im 54/81]
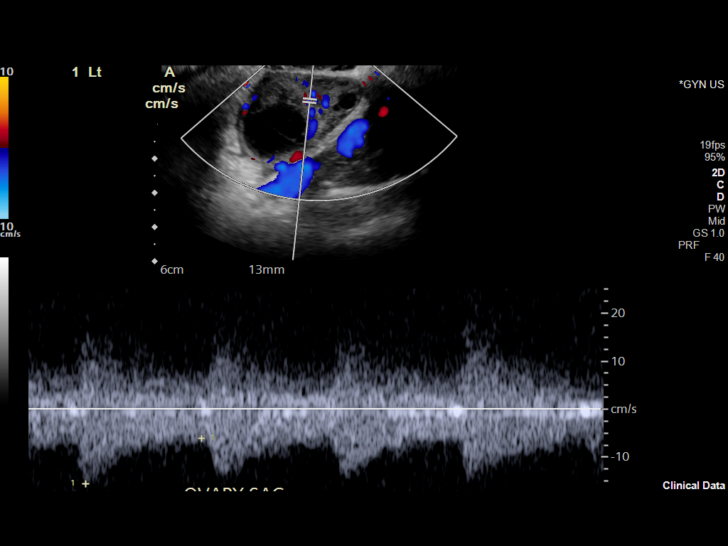
[im 61/81]
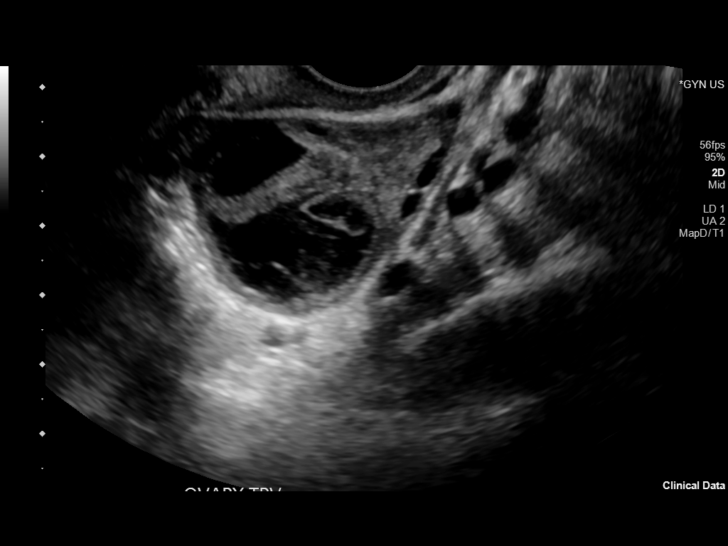
[im 67/81]
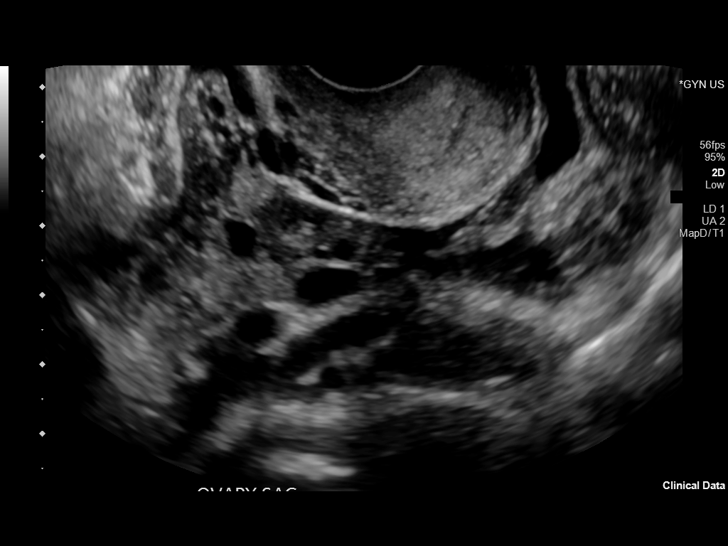
[im 74/81]
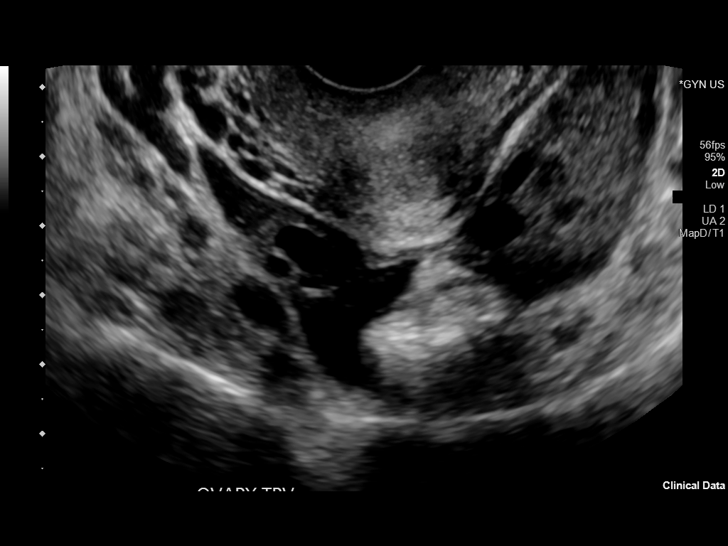
[im 81/81]
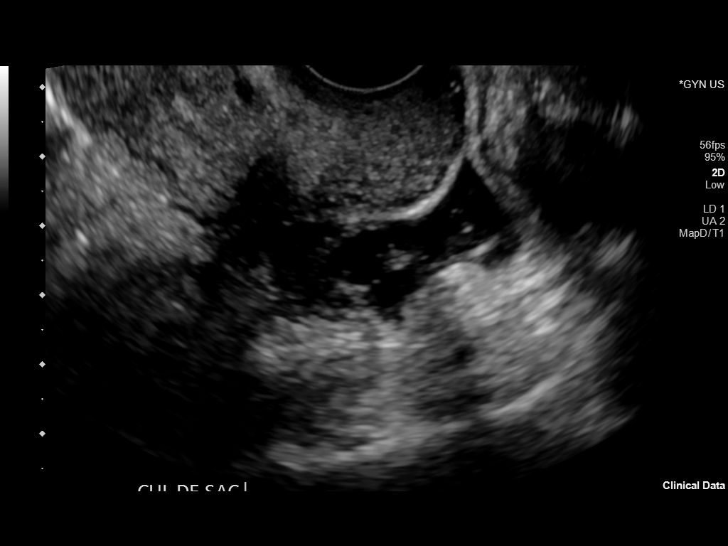

[13 of 25 positions shown; findings below may reference images not displayed]

FINDINGS: Uterus

Measurements: 6.5 cm x 2.9 cm x 3.7 cm = volume: 36.8 mL. No
fibroids or other mass visualized.

Endometrium

Thickness: 6.3 mm.  No focal abnormality visualized.

Right ovary

Measurements: 3.4 cm x 2.4 cm x 3.4 cm = volume: 14.7 mL. Normal
appearance/no adnexal mass.

Left ovary

Measurements: 4.5 cm x 2.7 cm x 4.4 cm = volume: 28.4 mL. A 2.2 cm x
2.1 cm x 2.3 cm complex anechoic structure is seen within the left
ovary. No flow is seen within this region on color Doppler
evaluation.

Pulsed Doppler evaluation of both ovaries demonstrates normal
low-resistance arterial and venous waveforms.

Other findings

A moderate amount of pelvic free fluid is seen
IMPRESSION: 1. Complex left ovarian cyst.
2. Moderate amount of pelvic free fluid.

## 2022-04-05 IMAGING — US US TRANSVAGINAL NON-OB
1 series · 15 of 25 positions shown · non-contrast
Comparison: 09/17/2020

CLINICAL DATA: LEFT ovarian cyst, follow-up

EXAM:
ULTRASOUND PELVIS TRANSVAGINAL
TECHNIQUE: Transvaginal ultrasound examination of the pelvis was performed
including evaluation of the uterus, ovaries, adnexal regions, and
pelvic cul-de-sac.

[Series 1: us transvaginal non-ob · 15 of 67 slices shown]
[im 1/67]
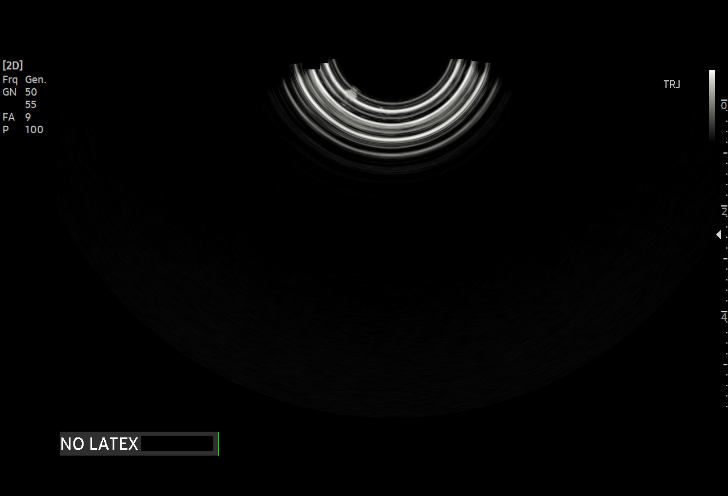
[im 6/67]
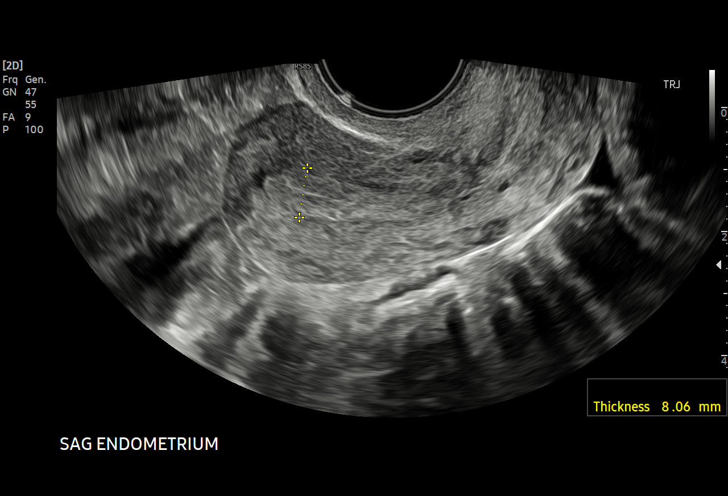
[im 12/67]
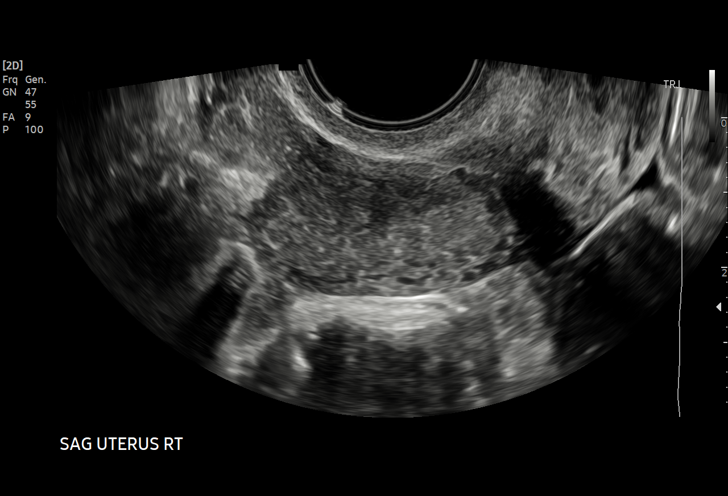
[im 14/67]
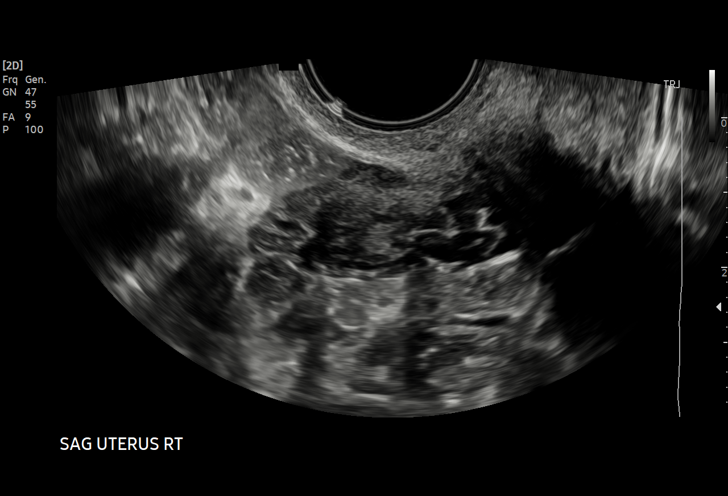
[im 20/67]
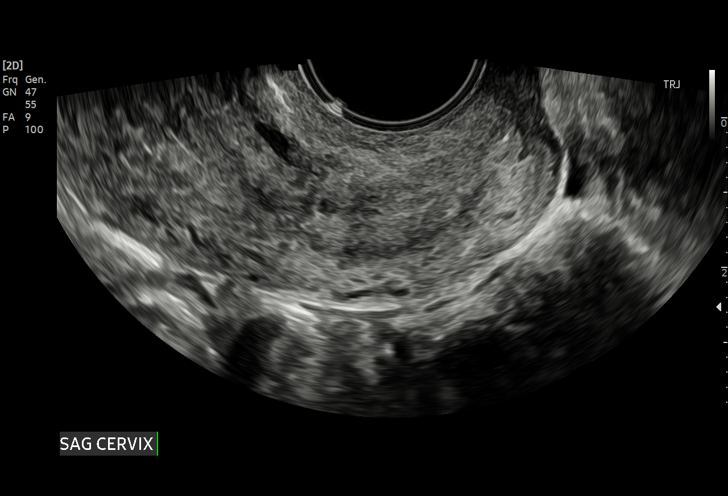
[im 25/67]
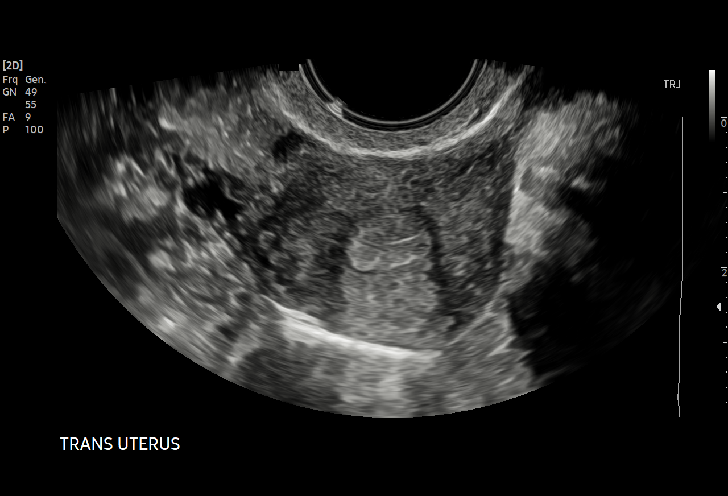
[im 28/67]
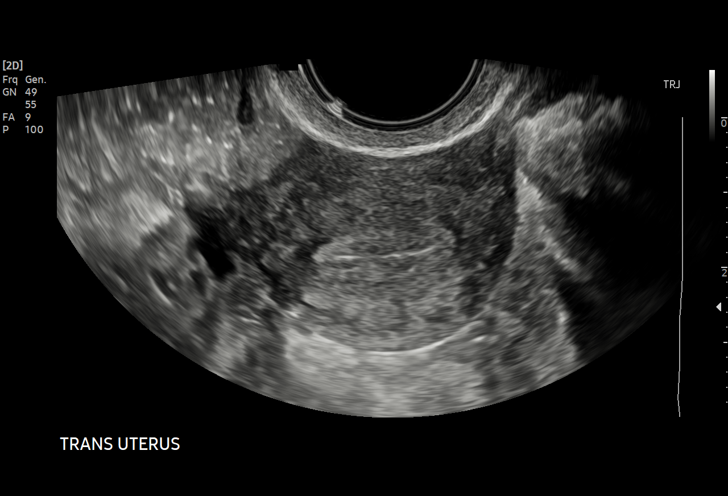
[im 34/67]
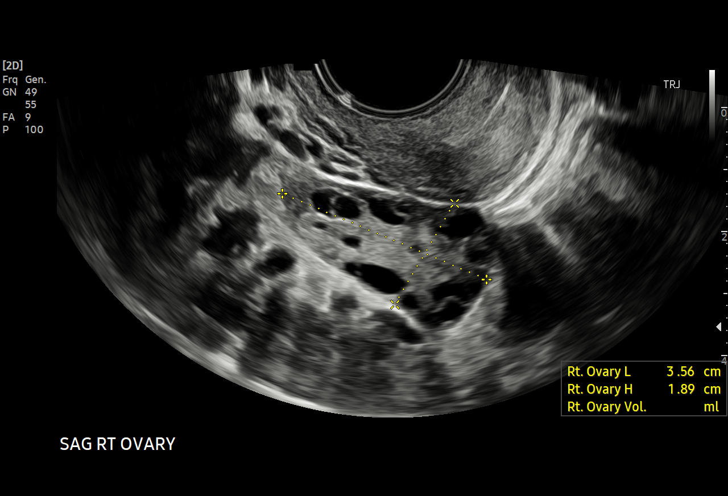
[im 39/67]
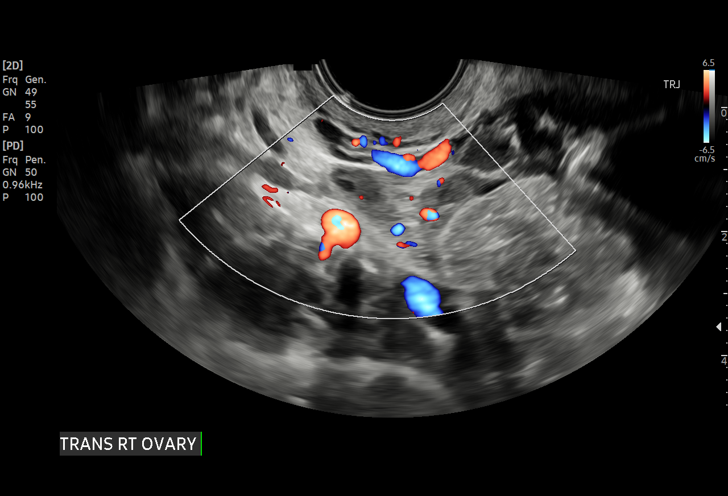
[im 42/67]
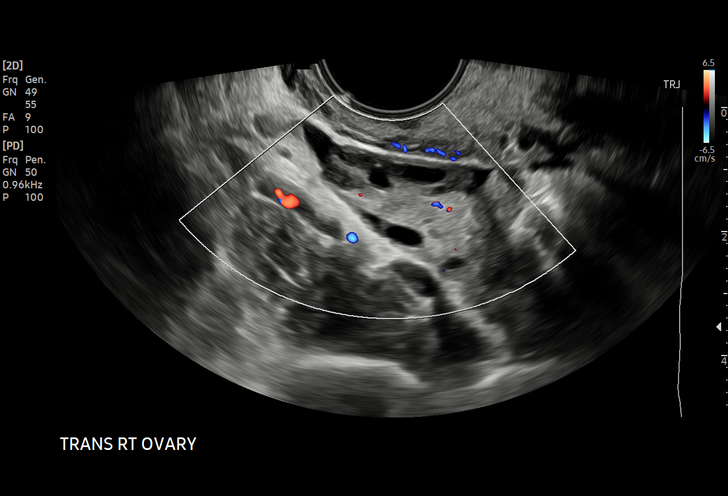
[im 47/67]
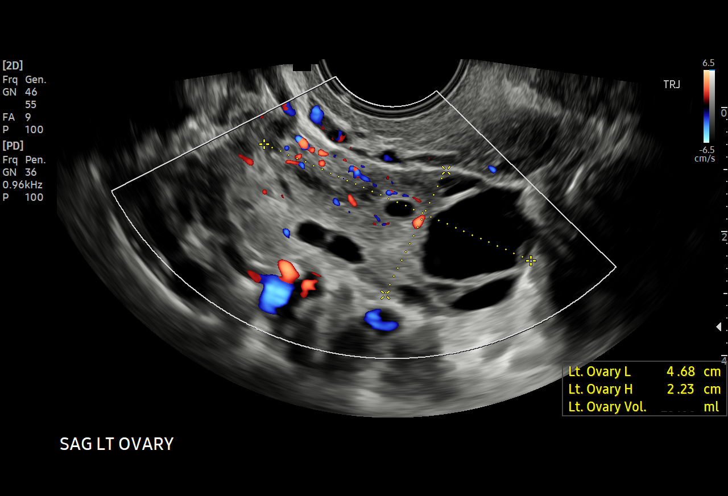
[im 53/67]
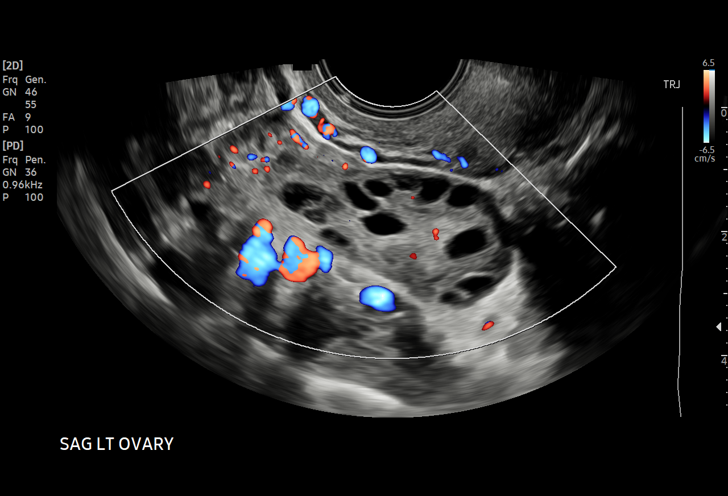
[im 56/67]
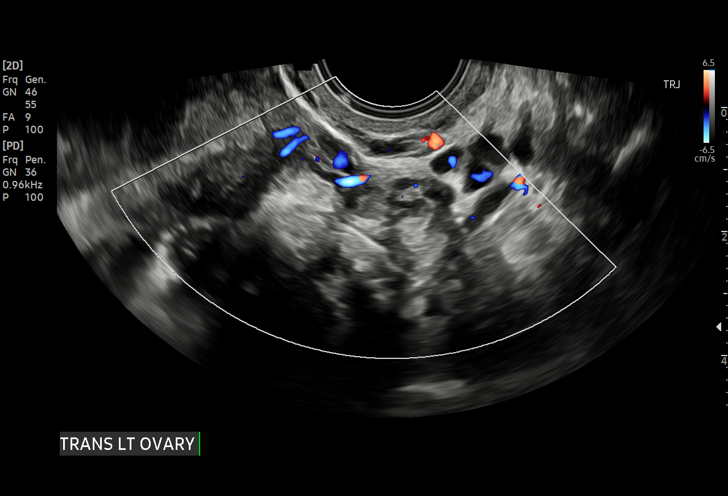
[im 61/67]
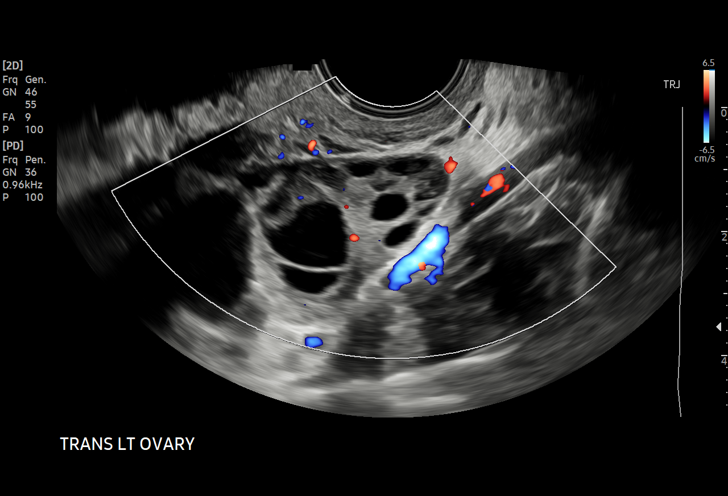
[im 67/67]
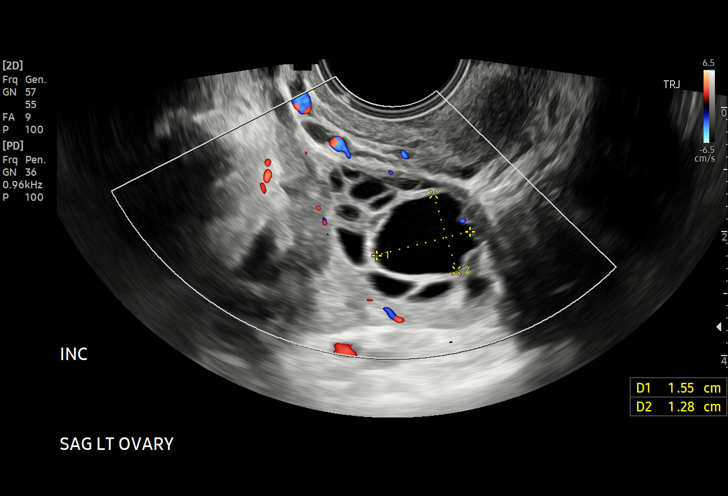

[15 of 25 positions shown; findings below may reference images not displayed]

FINDINGS: Uterus

Measurements: 7.1 x 3.1 x 3.5 cm = volume: 40 mL. Anteflexed. Normal
morphology without mass

Endometrium

Thickness: 8 mm.  No endometrial fluid or focal abnormality

Right ovary

Measurements: 3.6 x 1.9 x 2.3 cm = volume: 8.1 mL. Numerous
peripheral follicles without dominant mass

Left ovary

Measurements: 4.7 x 2.2 x 2.9 cm = volume: 16.0 mL. Numerous
peripheral follicles. Dominant simple follicle 15 mm diameter; no
follow-up imaging recommended. No additional mass.

Other findings:  Trace free pelvic fluid.  No adnexal masses.
IMPRESSION: Unremarkable uterus and endometrial complex.

Numerous peripheral follicles throughout both ovaries, nonspecific
but can be seen in patients with polycystic ovarian syndrome.

Small complicated likely hemorrhagic cyst within LEFT ovary on the
previous exam is no longer identified.

## 2022-10-04 ENCOUNTER — Encounter (HOSPITAL_COMMUNITY): Payer: Self-pay | Admitting: Obstetrics & Gynecology

## 2022-10-04 ENCOUNTER — Other Ambulatory Visit: Payer: Self-pay

## 2022-10-04 ENCOUNTER — Inpatient Hospital Stay (HOSPITAL_COMMUNITY)
Admission: AD | Admit: 2022-10-04 | Discharge: 2022-10-04 | Disposition: A | Discharge status: Home / Self Care | Payer: BC Managed Care – PPO | Attending: Obstetrics & Gynecology | Admitting: Obstetrics & Gynecology

## 2022-10-04 ENCOUNTER — Inpatient Hospital Stay (HOSPITAL_COMMUNITY): Payer: BC Managed Care – PPO

## 2022-10-04 DIAGNOSIS — R109 Unspecified abdominal pain: Secondary | ICD-10-CM | POA: Diagnosis not present

## 2022-10-04 DIAGNOSIS — O200 Threatened abortion: Secondary | ICD-10-CM

## 2022-10-04 DIAGNOSIS — Z3A01 Less than 8 weeks gestation of pregnancy: Secondary | ICD-10-CM | POA: Diagnosis not present

## 2022-10-04 DIAGNOSIS — O3481 Maternal care for other abnormalities of pelvic organs, first trimester: Secondary | ICD-10-CM | POA: Diagnosis not present

## 2022-10-04 DIAGNOSIS — O3680X1 Pregnancy with inconclusive fetal viability, fetus 1: Secondary | ICD-10-CM

## 2022-10-04 LAB — CBC
HCT: 34.4 % — ABNORMAL LOW (ref 36.0–46.0)
Hemoglobin: 12 g/dL (ref 12.0–15.0)
MCH: 30.2 pg (ref 26.0–34.0)
MCHC: 34.9 g/dL (ref 30.0–36.0)
MCV: 86.4 fL (ref 80.0–100.0)
Platelets: 326 10*3/uL (ref 150–400)
RBC: 3.98 MIL/uL (ref 3.87–5.11)
RDW: 12.1 % (ref 11.5–15.5)
WBC: 5.8 10*3/uL (ref 4.0–10.5)
nRBC: 0 % (ref 0.0–0.2)

## 2022-10-04 LAB — ABO/RH: ABO/RH(D): O POS

## 2022-10-04 LAB — POCT PREGNANCY, URINE: Preg Test, Ur: POSITIVE — AB

## 2022-10-04 LAB — WET PREP, GENITAL
Clue Cells Wet Prep HPF POC: NONE SEEN
Sperm: NONE SEEN
Trich, Wet Prep: NONE SEEN
WBC, Wet Prep HPF POC: <10 (ref <10)
Yeast Wet Prep HPF POC: NONE SEEN

## 2022-10-04 LAB — HCG, QUANTITATIVE, PREGNANCY: hCG, Beta Chain, Quant, S: 2178 m[IU]/mL — ABNORMAL HIGH (ref <5)

## 2022-10-04 NOTE — Discharge Instructions (Signed)
Prenatal Care Providers           [facility] [place] @ [facility]  [place] [phone]  [facility] @ [place]  [phone]  [place] @ [facility]       [address] [phone]            [facility] [place] @ [place]     [phone]          [facility] [place] @ [facility]   [phone]  [facility] [place] @ [place] [phone]     [facility] [place] @ [place] Linna Hoff)  [place]   [phone]     [facility]  Phone: [phone]

## 2022-10-04 NOTE — MAU Provider Note (Signed)
History     CSN: [id number]  Arrival date and time: 10/04/22 1325   None     Chief Complaint  Patient presents with   Abdominal Pain   Vaginal Bleeding   Angely Dietz, a  26 y.o. G2P0010 at [redacted]w[redacted]d presents to MAU with complaints of abdominal cramping that started 1 week ago and some "pink tinged" spotting with wiping that started today. Patient states states her abdominal cramping is come and go and rates 7/10. She denies attempting to relieve pain because "she didn't know what she can take."  She denies wearing or soaking a pad and denies passing clots. Patient denies abnormal vaginal discharge and urinary symptoms.       Abdominal Pain Pertinent negatives include no constipation, diarrhea, dysuria, fever, headaches, nausea or vomiting.  Vaginal Bleeding The patient's pertinent negatives include no pelvic pain or vaginal discharge. Associated symptoms include abdominal pain. Pertinent negatives include no back pain, chills, constipation, diarrhea, dysuria, fever, headaches, nausea or vomiting.    OB History     Gravida  2   Para  0   Term  0   Preterm  0   AB  1   Living  0      SAB  0   IAB  1   Ectopic  0   Multiple  0   Live Births  0           No past medical history on file.  No past surgical history on file.  Family History  Family history unknown: Yes    Social History   Tobacco Use   Smoking status: Never   Smokeless tobacco: Never  Vaping Use   Vaping Use: Never used  Substance Use Topics   Alcohol use: Yes    Comment: occasional   Drug use: Never    Allergies: No Known Allergies  Medications Prior to Admission  Medication Sig Dispense Refill Last Dose   ibuprofen (ADVIL) 200 MG tablet Take 400 mg by mouth every 6 (six) hours as needed. (Patient not taking: Reported on 01/20/2021)       Review of Systems  Constitutional:  Negative for chills, fatigue and fever.  Eyes:  Negative for pain and visual  disturbance.  Respiratory:  Negative for apnea, shortness of breath and wheezing.   Cardiovascular:  Negative for chest pain and palpitations.  Gastrointestinal:  Positive for abdominal pain. Negative for constipation, diarrhea, nausea and vomiting.  Genitourinary:  Positive for vaginal bleeding. Negative for difficulty urinating, dysuria, pelvic pain, vaginal discharge and vaginal pain.  Musculoskeletal:  Negative for back pain.  Neurological:  Negative for seizures, weakness and headaches.  Psychiatric/Behavioral:  Negative for suicidal ideas.    Physical Exam   Blood pressure 114/63, pulse 99, temperature 98.3 F (36.8 C), temperature source Oral, resp. rate 19, height 5' 5.5" (1.664 m), weight 77.4 kg, last menstrual period 08/31/2022, SpO2 100 %.  Physical Exam Vitals (Patient laughing and talking on the phone to her sister) and nursing note reviewed.  Constitutional:      General: She is not in acute distress.    Appearance: Normal appearance. She is well-developed. She is not ill-appearing.  HENT:     Head: Normocephalic.  Pulmonary:     Effort: Pulmonary effort is normal.  Musculoskeletal:     Cervical back: Normal range of motion.  Skin:    General: Skin is warm and dry.  Neurological:     Mental Status: She is alert and oriented  to person, place, and time.  Psychiatric:        Mood and Affect: Mood normal.     MAU Course  Procedures Orders Placed This Encounter  Procedures   Wet prep, genital   US OB LESS THAN 14 WEEKS WITH OB TRANSVAGINAL   CBC   hCG, quantitative, pregnancy   CBC   hCG, quantitative, pregnancy   Diet NPO time specified   Pregnancy, urine POC   ABO/Rh   Discharge patient   Results for orders placed or performed during the hospital encounter of 10/04/22 (from the past 24 hour(s))  Pregnancy, urine POC     Status: Abnormal   Collection Time: 10/04/22  2:03 PM  Result Value Ref Range   Preg Test, Ur POSITIVE (A) NEGATIVE  ABO/Rh      Status: None   Collection Time: 10/04/22  2:46 PM  Result Value Ref Range   ABO/RH(D) O POS    No rh immune globuloin      NOT A RH IMMUNE GLOBULIN CANDIDATE, PT RH POSITIVE Performed at [facility], [address], [place], [place] [address]   Wet prep, genital     Status: None   Collection Time: 10/04/22  2:52 PM   Specimen: PATH Cytology Cervicovaginal Ancillary Only  Result Value Ref Range   Yeast Wet Prep HPF POC NONE SEEN NONE SEEN   Trich, Wet Prep NONE SEEN NONE SEEN   Clue Cells Wet Prep HPF POC NONE SEEN NONE SEEN   WBC, Wet Prep HPF POC <10 <10   Sperm NONE SEEN    US OB LESS THAN 14 WEEKS WITH OB TRANSVAGINAL  Result Date: 10/04/2022 CLINICAL DATA:  Vaginal bleeding, early pregnancy EXAM: OBSTETRIC <14 WK [place] AND TRANSVAGINAL OB US TECHNIQUE: Both transabdominal and transvaginal ultrasound examinations were performed for complete evaluation of the gestation as well as the maternal uterus, adnexal regions, and pelvic cul-de-sac. Transvaginal technique was performed to assess early pregnancy. COMPARISON:  None Available. FINDINGS: Intrauterine gestational sac: Single Yolk sac:  Absent Embryo:  Absent Cardiac Activity: N/A MSD: 4.3 mm   5 w   1 d Subchorionic hemorrhage:  None visualized. Maternal uterus/adnexae: Simple left ovarian cyst 3.8 by 2.9 by 3.2 cm. Trace free pelvic fluid. Suspected double decidual reaction for example on image 47 of series 1 -1. IMPRESSION: 1. Intrauterine gestational sac observed with suspected double decidual reaction. Probable early intrauterine gestational sac, but no yolk sac, fetal pole, or cardiac activity yet visualized. Recommend follow-up quantitative B-HCG levels and follow-up US in 14 days to assess viability. This recommendation follows SRU consensus guidelines: Diagnostic Criteria for Nonviable Pregnancy Early in the First Trimester. Malva Limes Med 2013; 628:3151-76. 2. Simple appearing cystic lesion of the left ovary, without a masslike  appearance or surrounding hypervascularity. Electronically Signed   By: Gaylyn Rong M.D.   On: 10/04/2022 15:24     MDM Due to MAU acuity, Orders placed from Triage   Wet Prep normal  GC Pending Gestational sac noted on [place] likely normal IUP.  Quant & CBC not in process @ 1600 Pain improved without intervention.  Low suspicion for ectopic pregnancy based on [place] results.   Difficulty with lab processing.  CBC and HCG recollected.  Plan for discharge   Assessment and Plan   1. Abdominal cramping   2. [redacted] weeks gestation of pregnancy   3. Threatened miscarriage    - Discussed that this is likely early pregnancy with gestational sac.  Also discussed differential diagnosis of threatened miscarriage and the recommendation is repeat quant and a viability [place] in 14 days. Patient agreeable to plan of care.  - Patient scheduled to Repeat Quant in 48 hours at [facility] - Message sent to office for Viability [place] -Strict bleeding and return precautions reviewed.  - CBC & HCG in process at time of discharge. CNM will notify patient of concerning results.  - Safe medication in pregnancy and list of OB/GYN providers given.  - Patient discharged home in stable condition and may return to MAU as needed.   Claudette Head, MSN CNM  10/04/2022, 4:34 PM

## 2022-10-04 NOTE — MAU Note (Signed)
Vicki Cline is a 26 y.o. at Unknown here in MAU reporting: she's having lower abdominal cramping and spotting with wiping.  Reports has been cramping for approximately 1 week, but spotting started today.  States had a +HPT. LMP: 08/31/2022 Onset of complaint: today Pain score: 7 Vitals:   10/04/22 1356  BP: 114/63  Pulse: 99  Resp: 19  Temp: 98.3 F (36.8 C)  SpO2: 100%     FHT:N/A Lab orders placed from triage:   UPT

## 2022-10-05 LAB — GC/CHLAMYDIA PROBE AMP (~~LOC~~) NOT AT ARMC
Chlamydia: NEGATIVE
Comment: NEGATIVE
Comment: NORMAL
Neisseria Gonorrhea: NEGATIVE

## 2022-10-06 ENCOUNTER — Ambulatory Visit (INDEPENDENT_AMBULATORY_CARE_PROVIDER_SITE_OTHER): Payer: BC Managed Care – PPO

## 2022-10-06 ENCOUNTER — Other Ambulatory Visit: Payer: Self-pay

## 2022-10-06 ENCOUNTER — Ambulatory Visit: Payer: Self-pay

## 2022-10-06 VITALS — BP 111/79 | HR 94 | Wt 166.8 lb

## 2022-10-06 DIAGNOSIS — O3680X0 Pregnancy with inconclusive fetal viability, not applicable or unspecified: Secondary | ICD-10-CM

## 2022-10-06 LAB — BETA HCG QUANT (REF LAB): hCG Quant: 3219 m[IU]/mL

## 2022-10-06 NOTE — Progress Notes (Addendum)
Beta HCG Follow-up Visit  Vicki Cline presents to [place] for follow-up beta HCG lab. She was seen in MAU for abdominal pain and vaginal spotting on 10/04/22. [place] that day shows gestational sac within uterus. Patient denies bleeding today. Reports intermittent abdominal cramping. Discussed with patient that we are following beta HCG levels today. Will call patient with results.  Beta HCG results: 10/04/22 19:21 2178  10/06/22 10:43 3219   Results and patient history reviewed with Manya Silvas, CNM who recommends repeat in 48 hours due to 48% rise in HCG level. Called pt and reviewed plan of care. Pt able to come to MAU on 10/08/22 afternoon.  [name] 10/06/2022 8:24 AM

## 2022-10-07 ENCOUNTER — Ambulatory Visit: Payer: BC Managed Care – PPO

## 2022-10-08 ENCOUNTER — Encounter (HOSPITAL_COMMUNITY): Payer: BC Managed Care – PPO

## 2022-10-08 ENCOUNTER — Inpatient Hospital Stay (HOSPITAL_COMMUNITY)
Admission: AD | Admit: 2022-10-08 | Discharge: 2022-10-08 | Disposition: A | Discharge status: Home / Self Care | Payer: BC Managed Care – PPO | Attending: Family Medicine | Admitting: Family Medicine

## 2022-10-08 NOTE — Progress Notes (Signed)
Encounter opened in error

## 2022-10-18 ENCOUNTER — Other Ambulatory Visit: Payer: BC Managed Care – PPO

## 2022-10-19 ENCOUNTER — Ambulatory Visit
Admission: RE | Admit: 2022-10-19 | Discharge: 2022-10-19 | Disposition: A | Discharge status: Home / Self Care | Payer: BC Managed Care – PPO | Source: Ambulatory Visit | Attending: Certified Nurse Midwife | Admitting: Certified Nurse Midwife

## 2022-10-19 DIAGNOSIS — O3680X1 Pregnancy with inconclusive fetal viability, fetus 1: Secondary | ICD-10-CM | POA: Insufficient documentation

## 2022-11-09 ENCOUNTER — Telehealth (INDEPENDENT_AMBULATORY_CARE_PROVIDER_SITE_OTHER): Payer: BC Managed Care – PPO

## 2022-11-09 DIAGNOSIS — Z3490 Encounter for supervision of normal pregnancy, unspecified, unspecified trimester: Secondary | ICD-10-CM

## 2022-11-09 DIAGNOSIS — Z3480 Encounter for supervision of other normal pregnancy, unspecified trimester: Secondary | ICD-10-CM | POA: Insufficient documentation

## 2022-11-09 DIAGNOSIS — Z3689 Encounter for other specified antenatal screening: Secondary | ICD-10-CM

## 2022-11-09 HISTORY — DX: Encounter for supervision of normal pregnancy, unspecified, unspecified trimester: Z34.90

## 2022-11-09 NOTE — Progress Notes (Addendum)
New OB Intake  I connected with Vicki Cline  on 11/09/22 at 10:15 AM EST by MyChart Video Visit and verified that I am speaking with the correct person using two identifiers. Nurse is located at [facility] and pt is located at home.  I discussed the limitations, risks, security and privacy concerns of performing an evaluation and management service by telephone and the availability of in person appointments. I also discussed with the patient that there may be a patient responsible charge related to this service. The patient expressed understanding and agreed to proceed.  I explained I am completing New OB Intake today. We discussed EDD of 06/06/2023 that is based on ultrasound of 10/19/2022. Pt is G2/P0. I reviewed her allergies, medications, Medical/Surgical/OB history, and appropriate screenings. I informed her of [facility] services. [facility] information placed in AVS. Based on history, this is a low risk pregnancy. Patient Active Problem List   Diagnosis Date Noted   Supervision of other normal pregnancy, antepartum 11/09/2022    -Had [facility] done at [facility] year. Patient will sign medical release record at her initial ob for records.   Delivery Plans Plans to deliver at [facility]. Patient given information for [facility] website for more information about [facility]. Patient is interested in water birth. Offered upcoming OB visit with CNM to discuss further.  MyChart/Babyscripts MyChart access verified. I explained pt will have some visits in office and some virtually. Babyscripts instructions given and order placed. Patient verifies receipt of registration text/e-mail. Account successfully created and app downloaded.  Blood Pressure Cuff/Weight Scale Patient has private insurance; instructed to purchase blood pressure cuff and bring to first prenatal appt. Explained after first prenatal appt pt will check weekly and document in Babyscripts.  Patient does not have weight scale; patient may purchase if they desire to track weight weekly in Babyscripts.  Anatomy US Explained first scheduled [place] will be around 19 weeks. Anatomy US scheduled for 01/10/2023 at 7:45 AM. Pt notified to arrive at 7:30 AM.  Labs Discussed Natera genetic screening with patient. Would like both Panorama and Horizon drawn at new OB visit. Routine prenatal labs needed.  COVID Vaccine Patient has had COVID vaccine.   Is patient a CenteringPregnancy candidate?  Declined Declined due to Would like to stick with provider Vicki Cline, CNM   Is patient a Mom+Baby Combined Care candidate?  Declined    Social Determinants of Health Food Insecurity: Patient denies food insecurity. WIC Referral: Patient is interested in referral to [facility].  Transportation: Patient denies transportation needs. Childcare: Discussed no children allowed at ultrasound appointments. Offered childcare services; patient declines childcare services at this time.  First visit review I reviewed new OB appt with patient. I explained they will have a provider visit that includes initial ob labs, genetic screening, ob culture, HGB A1C. Explained pt will be seen by Vicki Cline, CNM  at first visit; encounter routed to appropriate provider. Explained that patient will be seen by pregnancy navigator following visit with provider.   [name], [place] 11/09/2022  10:04 AM

## 2022-11-24 ENCOUNTER — Encounter: Payer: Self-pay | Admitting: Certified Nurse Midwife

## 2022-11-24 ENCOUNTER — Other Ambulatory Visit (HOSPITAL_COMMUNITY)
Admission: RE | Admit: 2022-11-24 | Discharge: 2022-11-24 | Disposition: A | Discharge status: Home / Self Care | Payer: BC Managed Care – PPO | Source: Ambulatory Visit | Attending: Certified Nurse Midwife | Admitting: Certified Nurse Midwife

## 2022-11-24 ENCOUNTER — Other Ambulatory Visit: Payer: Self-pay

## 2022-11-24 ENCOUNTER — Ambulatory Visit (INDEPENDENT_AMBULATORY_CARE_PROVIDER_SITE_OTHER): Payer: BC Managed Care – PPO | Admitting: Certified Nurse Midwife

## 2022-11-24 VITALS — BP 117/71 | HR 88 | Ht 65.0 in | Wt 168.7 lb

## 2022-11-24 DIAGNOSIS — Z3492 Encounter for supervision of normal pregnancy, unspecified, second trimester: Secondary | ICD-10-CM | POA: Insufficient documentation

## 2022-11-24 DIAGNOSIS — N939 Abnormal uterine and vaginal bleeding, unspecified: Secondary | ICD-10-CM

## 2022-11-24 DIAGNOSIS — Z3480 Encounter for supervision of other normal pregnancy, unspecified trimester: Secondary | ICD-10-CM

## 2022-11-24 DIAGNOSIS — Z3A12 12 weeks gestation of pregnancy: Secondary | ICD-10-CM

## 2022-11-24 NOTE — Progress Notes (Signed)
History:   Vicki Cline is a 26 y.o. G2P0010 at [redacted]w[redacted]d by early ultrasound being seen today for her first obstetrical visit.  Her obstetrical history is significant for a TAB in 2020, Abnormal PAP with ASCUS in 2020 and a history of ovarian cysts.  Patient does intend to breast feed and supplement with bottle feeding. Pregnancy history fully reviewed. Patient undecided on Peds and is interested in [place].   Patient reports  some light brown vaginal spotting. Mainly after intercourse. She denies bright red vaginal bleeding pr abdominal pain. She denies pain with intercourse.  Marland Kitchen      HISTORY: OB History  Gravida Para Term Preterm AB Living  2 0 0 0 1 0  SAB IAB Ectopic Multiple Live Births  0 1 0 0 0    # Outcome Date GA Lbr Len/2nd Weight Sex Delivery Anes PTL Lv  2 Current           1 IAB 2020            Per Patient report Last pap smear was done 2023 at [facility] and was normal  Past Medical History:  Diagnosis Date   Medical history non-contributory    History reviewed. No pertinent surgical history. Family History  Family history unknown: Yes   Social History   Tobacco Use   Smoking status: Never   Smokeless tobacco: Never  Vaping Use   Vaping Use: Never used  Substance Use Topics   Alcohol use: Not Currently    Comment: occasional   Drug use: Never   No Known Allergies Current Outpatient Medications on File Prior to Visit  Medication Sig Dispense Refill   Prenatal Vit-Fe Fumarate-FA (MULTIVITAMIN-PRENATAL) 27-0.8 MG TABS tablet Take 1 tablet by mouth daily at 12 noon.     ibuprofen (ADVIL) 200 MG tablet Take 400 mg by mouth every 6 (six) hours as needed. (Patient not taking: Reported on 01/20/2021)     No current facility-administered medications on file prior to visit.    Review of Systems Pertinent items noted in HPI and remainder of comprehensive ROS otherwise negative.  Indications for ASA therapy (per UpToDate) One of the  following: Previous pregnancy with preeclampsia, especially early onset and with an adverse outcome No Multifetal gestation No Chronic hypertension No Type 1 or 2 diabetes mellitus No Chronic kidney disease No Autoimmune disease (antiphospholipid syndrome, systemic lupus erythematosus) No Two or more of the following: Nulliparity Yes Obesity (body mass index >30 kg/m2) No Family history of preeclampsia in mother or sister No Patient endorses HTN and breast cancer in her Maternal Grandmother.  Age ?35 years No Sociodemographic characteristics (African American race, low socioeconomic level) Yes Personal risk factors (eg, previous pregnancy with low birth weight or small for gestational age infant, previous adverse pregnancy outcome [eg, stillbirth], interval >10 years between pregnancies) No  Indications for early GDM screening (FBS, A1C, Random CBG, GTT) First-degree relative with diabetes No BMI >30kg/m2 No Age > 35 No Previous birth of an infant weighing ?4000 g No Gestational diabetes mellitus in a previous pregnancy No Glycated hemoglobin ?5.7 percent (39 mmol/mol), impaired glucose tolerance, or impaired fasting glucose on previous testing No High-risk race/ethnicity (eg, African American, Latino, Native American, [place] American, Pacific Islander) Yes Previous stillbirth of unknown cause No Maternal birthweight > 9 lbs No History of cardiovascular disease No Hypertension or on therapy for hypertension No High-density lipoprotein cholesterol level <35 mg/dL (3.47 mmol/L) and/or a triglyceride level >250 mg/dL (4.25 mmol/L) No Polycystic  ovary syndrome No Physical inactivity No Other clinical condition associated with insulin resistance (eg, severe obesity, acanthosis nigricans) No Current use of glucocorticoids No   Physical Exam:   Vitals:   11/24/22 1009  BP: 117/71  Pulse: 88  Weight: 168 lb 11.2 oz (76.5 kg)   Fetal Heart Rate (bpm): 166   General: well-developed,  well-nourished female in no acute distress  Skin: normal coloration and turgor, no rashes  Neurologic: oriented, normal, negative, normal mood  Extremities: normal strength, tone, and muscle mass, ROM of all joints is normal  HEENT PERRLA, extraocular movement intact and sclera clear, anicteric  Neck supple and no masses  Cardiovascular: regular rate and rhythm  Respiratory:  no respiratory distress, normal breath sounds  Abdomen: soft, non-tender;   Pelvic: Exam deferred      Assessment:    Pregnancy: G2P0010 Patient Active Problem List   Diagnosis Date Noted   Supervision of other normal pregnancy, antepartum 11/09/2022     Plan:    1. Encounter for supervision of low-risk pregnancy in second trimester - Patient denies fetal movement at this time. Reassured patient that this is normal and she has up to [redacted] weeks gestation to feel movement.  - Patient friend "Ms. Mearl Latin" present for visit today and supportive.  - Briefly discussed Waterbirth requirements and other plans for delivery.  - Request patient to bring OBGYN records from outside office.   2. [redacted] weeks gestation of pregnancy - Anatomy US scheduled for January 2024. - Daily Baby Asprin to decrease the Risk of PreE in later pregnancy based on UpToDate Recommendations.   3. Supervision of other normal pregnancy, antepartum - Culture, OB Urine - CBC/D/Plt+RPR+Rh+ABO+RubIgG... - GC/Chlamydia probe amp ([place])not at [facility] - Hemoglobin A1c - Panorama Prenatal Test Full Panel - HORIZON Custom  4. Vaginal spotting - Reviewed that post-coital spotting can be a normal occurrence in early pregnancy - Discussed warning signs and and worsening signs and when to report to MAU.  - Patient verbalized understanding.   - WET PREP FOR [place] collected today - Will notify patient of concerning results.    Initial labs drawn. Continue prenatal vitamins. Problem list reviewed and updated. Genetic Screening discussed,  Panorama and Horizon: requested. Ultrasound discussed; fetal anatomic survey: scheduled. Anticipatory guidance about prenatal visits given including labs, ultrasounds, and testing. Weight gain recommendations per IOM guidelines reviewed: underweight/BMI 18.5 or less > 28 - 40 lbs; normal weight/BMI 18.5 - 24.9 > 25 - 35 lbs; overweight/BMI 25 - 29.9 > 15 - 25 lbs; obese/BMI  30 or more > 11 - 20 lbs. Discussed usage of the Babyscripts app for more information about pregnancy, and to track blood pressures. Also discussed usage of virtual visits as additional source of managing and completing prenatal visits.  Patient was encouraged to use MyChart to review results, send requests, and have questions addressed.   The nature of Center for [facility]/Faculty Practice with multiple MDs and Advanced Practice Providers was explained to patient; also emphasized that residents, students are part of our team. Routine obstetric precautions reviewed. Encouraged to seek out care at our office or emergency room [facility] MAU preferred) for urgent and/or emergent concerns. Return in about 4 weeks (around 12/22/2022) for LOB.     Quinlan Vollmer Isaias Sakai) Rollene Rotunda, MSN, [facility] for [place]  11/24/22 11:10 AM

## 2022-11-24 NOTE — Addendum Note (Signed)
Addended by: Cinda Quest A on: 11/24/2022 04:36 PM   Modules accepted: Orders

## 2022-11-25 LAB — CBC/D/PLT+RPR+RH+ABO+RUBIGG...
Antibody Screen: NEGATIVE
Basophils Absolute: 0 10*3/uL (ref 0.0–0.2)
Basos: 1 %
EOS (ABSOLUTE): 0 10*3/uL (ref 0.0–0.4)
Eos: 1 %
HCV Ab: NONREACTIVE
HIV Screen 4th Generation wRfx: NONREACTIVE
Hematocrit: 34.8 % (ref 34.0–46.6)
Hemoglobin: 11.7 g/dL (ref 11.1–15.9)
Hepatitis B Surface Ag: NEGATIVE
Immature Grans (Abs): 0 10*3/uL (ref 0.0–0.1)
Immature Granulocytes: 1 %
Lymphocytes Absolute: 1.1 10*3/uL (ref 0.7–3.1)
Lymphs: 20 %
MCH: 29.4 pg (ref 26.6–33.0)
MCHC: 33.6 g/dL (ref 31.5–35.7)
MCV: 87 fL (ref 79–97)
Monocytes Absolute: 0.4 10*3/uL (ref 0.1–0.9)
Monocytes: 7 %
Neutrophils Absolute: 3.9 10*3/uL (ref 1.4–7.0)
Neutrophils: 70 %
Platelets: 300 10*3/uL (ref 150–450)
RBC: 3.98 x10E6/uL (ref 3.77–5.28)
RDW: 12.5 % (ref 11.7–15.4)
RPR Ser Ql: NONREACTIVE
Rh Factor: POSITIVE
Rubella Antibodies, IGG: 3.71 index (ref >0.99)
WBC: 5.5 10*3/uL (ref 3.4–10.8)

## 2022-11-25 LAB — HEMOGLOBIN A1C
Est. average glucose Bld gHb Est-mCnc: 108 mg/dL
Hgb A1c MFr Bld: 5.4 % (ref 4.8–5.6)

## 2022-11-25 LAB — GC/CHLAMYDIA PROBE AMP (~~LOC~~) NOT AT ARMC
Chlamydia: NEGATIVE
Comment: NEGATIVE
Comment: NORMAL
Neisseria Gonorrhea: NEGATIVE

## 2022-11-25 LAB — HCV INTERPRETATION

## 2022-11-26 LAB — URINE CULTURE, OB REFLEX

## 2022-11-26 LAB — CULTURE, OB URINE

## 2022-12-01 LAB — PANORAMA PRENATAL TEST FULL PANEL:PANORAMA TEST PLUS 5 ADDITIONAL MICRODELETIONS: FETAL FRACTION: 7.4

## 2022-12-01 LAB — HORIZON CUSTOM: REPORT SUMMARY: NEGATIVE

## 2022-12-19 NOTE — L&D Delivery Note (Signed)
Delivery Note 27 y.o. G2P0010 at [redacted]w[redacted]d admitted for spontaneous onset of labor and SROM.  At 7:15 AM a viable female was delivered via Vaginal, Spontaneous (Presentation: Right Occiput Anterior).  APGAR: 7, 9; weight  TBD   Placenta status: Spontaneous, Intact.  Cord: 3 vessels with the following complications: None.  Cord pH: not collected  Anesthesia: Epidural Episiotomy: None Lacerations: None Suture Repair:  none Est. Blood Loss (mL): 106  Mom to postpartum.  Baby to Couplet care / Skin to Skin.  Sheppard Evens MD MPH OB Fellow, Faculty Practice [facility], Center for [facility] 06/04/2023

## 2023-01-10 ENCOUNTER — Ambulatory Visit: Payer: BC Managed Care – PPO | Attending: Certified Nurse Midwife

## 2023-01-10 DIAGNOSIS — Z3480 Encounter for supervision of other normal pregnancy, unspecified trimester: Secondary | ICD-10-CM | POA: Insufficient documentation

## 2023-01-10 DIAGNOSIS — Z3A19 19 weeks gestation of pregnancy: Secondary | ICD-10-CM | POA: Diagnosis not present

## 2023-01-10 DIAGNOSIS — O321XX0 Maternal care for breech presentation, not applicable or unspecified: Secondary | ICD-10-CM | POA: Diagnosis not present

## 2023-01-10 DIAGNOSIS — Z363 Encounter for antenatal screening for malformations: Secondary | ICD-10-CM | POA: Diagnosis not present

## 2023-01-17 ENCOUNTER — Ambulatory Visit (INDEPENDENT_AMBULATORY_CARE_PROVIDER_SITE_OTHER): Payer: BC Managed Care – PPO | Admitting: Advanced Practice Midwife

## 2023-01-17 ENCOUNTER — Other Ambulatory Visit: Payer: Self-pay

## 2023-01-17 VITALS — BP 109/70 | HR 94 | Wt 173.0 lb

## 2023-01-17 DIAGNOSIS — R87610 Atypical squamous cells of undetermined significance on cytologic smear of cervix (ASC-US): Secondary | ICD-10-CM | POA: Insufficient documentation

## 2023-01-17 DIAGNOSIS — Z3480 Encounter for supervision of other normal pregnancy, unspecified trimester: Secondary | ICD-10-CM | POA: Diagnosis not present

## 2023-01-17 DIAGNOSIS — Z3482 Encounter for supervision of other normal pregnancy, second trimester: Secondary | ICD-10-CM

## 2023-01-17 DIAGNOSIS — Z3A20 20 weeks gestation of pregnancy: Secondary | ICD-10-CM

## 2023-01-17 LAB — POCT URINALYSIS DIP (DEVICE)
Bilirubin Urine: NEGATIVE
Glucose, UA: NEGATIVE mg/dL
Hgb urine dipstick: NEGATIVE
Ketones, ur: NEGATIVE mg/dL
Nitrite: NEGATIVE
Protein, ur: NEGATIVE mg/dL
Specific Gravity, Urine: 1.02 (ref 1.005–1.030)
Urobilinogen, UA: 0.2 mg/dL (ref 0.0–1.0)
pH: 7 (ref 5.0–8.0)

## 2023-01-17 NOTE — Patient Instructions (Signed)
[email or web address]   AREA PEDIATRIC/FAMILY PRACTICE PHYSICIANS  Central/[place]outheast [place] ([address]) [facility] Chambliss, MD; Eniola, MD; Hale, MD; Hensel, MD; McDiarmid, MD; McIntyer, MD; Neal, MD; Walden, MD [address] [place], [place] [address] [phone] Mon-Fri 8:30-12:30, 1:30-5:00 Providers come to see babies at [facility] Accepting Medicaid [facility] providers who accept newborns: Koirala, MD; Morrow, MD; Wolters, MD [address], [place], [place] [address] [phone] Mon-Fri 8:00-5:30 Babies seen by providers at [facility] Does NOT accept Medicaid Please call early in hospitalization for appointment (limited availability)  [facility] Mulberry, MD [address] [place], [place] [address] [phone] Mon, Tue, Thur, Fri 8:30-5:00, Wed 10:00-7:00 (closed 1-2pm) Babies seen by [facility] providers Accepting Medicaid Rubin - Pediatrician Rubin, MD [address], [place], [place] [address] [phone] Mon-Fri 8:30-5:00, Sat 8:30-12:00 Provider comes to see babies at [facility] Accepting Medicaid Must have been referred from current patients or contacted office prior to delivery [facility] ([facility]) Brown, MD; Chandler, MD; Ettefagh, MD; Grant, MD; Lester, MD; McCormick, MD; McQueen, MD; Prose, MD; Simha, MD; Stanley, MD; Stryffeler, NP; Tebben, NP [address], [place], [place] [address] [phone] Mon, Tue, Thur, Fri 8:30-5:30, Wed 9:30-5:30, Sat 8:30-12:30 Babies seen by [facility] providers Accepting Medicaid Only accepting infants of first-time parents or siblings of current patients Hospital discharge coordinator will make follow-up appointment Jack Amos [address], [place], [place]  [address] [phone]   Fax -  [phone] [facility] [address], [place], [place]  [address] Phone - [phone]   Fax - [phone] Shilpa Gosrani [address], [place], [place]  [address] [phone]  [place]ortheast [place] ([address]) [facility] Bates, MD; Brassfield, MD; Cooper, Cox, MD; MD; Davis, MD; Dovico, MD; Ettefaugh, MD; Little, MD; Lowe, MD; Keiffer, MD; Melvin, MD; Sumner, MD; Williams, MD [address], [place], [place] [address] [phone] Mon-Fri 8:30-5:00 (extended evenings Mon-Thur as needed), Sat-Sun 10:00-1:00 Providers come to see babies at [facility] Accepting Medicaid for families of first-time babies and families with all children in the household age 3 and under. Must register with office prior to making appointment (M-F only). [facility] Henson, NP; Knapp, MD; Lalonde, MD; Tysinger, PA [address] [place], [place] [address] [phone] Mon-Fri 8:00-5:00 Babies seen by providers at [facility] Does NOT accept Medicaid/Commercial Insurance Only [facility] ([facility])  Artis, MD; Barnes, MD; Bratton, MD; Coccaro, MD; Lockett Gardner, MD; Kramer, MD; Marshall, MD; Netherton, MD; Poleto, MD; Skinner, MD [address] [place], [place] [address] [phone] Mon-Fri 8:30-5:30, Sat (Oct.-Mar.) 9:00-1:00 Babies seen by providers at [facility] Accepting Medicaid  [place] ([address]) [facility] Reid, MD; Warner, MD [address], [place], [place] [address] [phone] Mon-Fri 8:30-5:00, Sat 8:30-12:00 Providers come to see babies at [facility] Does NOT accept Medicaid [facility] Becker, PA; Hagler, MD; Scifres, PA; Sun, MD; Swayne, MD [address], [place], [place] [address] [phone] Mon-Fri 8:00-5:00 Babies seen by providers at [facility] Does NOT accept  Medicaid Only accepting babies of parents who are patients Please call early in hospitalization for appointment (limited availability)  Pediatricians Clark, MD; Frye, MD; Kelleher, MD; Mack, NP; Miller, MD; O'Keller, MD; Patterson, NP; Pudlo, MD; Puzio, MD; Thomas, MD; Tucker, MD;   Twiselton, MD [address], [place], [place] [address] [phone] Mon-Fri 8:00-5:00, Sat 9:00-12:00 Providers come to see babies at [facility] Does NOT accept Medicaid  [place] ([address]) [facility] providers accepting new patients: Brake, NP; Wharton, PA [address], [place], [place] [address] [phone] Mon-Fri 8:00-5:00 Babies seen by providers at [facility] Does NOT accept Medicaid Only accepting babies of parents who are patients Please call early in hospitalization for appointment (limited availability) [facility] Gay, MD; Quinlan, MD [address] [place], [place] [address] [phone] (press [phone] to schedule appointment) Mon-Fri 8:00-5:00 Providers come to see babies at [facility] Does NOT accept Medicaid [facility] Mazer, MD [address] [place], [place] [address] [phone] Mon-Fri 8:30-5:00 (lunch 12:30-1:00), extended hours by appointment only Wed 5:00-6:30 Babies seen by [facility] providers Accepting Medicaid [facility] Banks, MD; Jordan, MD; Koberlein, MD [address], [place], [place] [address] [phone] Mon-Fri 8:00-5:00 Babies seen by [facility] providers Does NOT accept Medicaid [facility] Parker, MD; Hunter, MD; Wallace, DO [address] [place], [place] [address] [phone] Mon-Fri 8:00-5:00 Babies seen by [facility] providers Does NOT accept Medicaid [facility] Brandon, PA; Brecken, PA; Christy, NP; Dees, MD; DeClaire, MD; DeWeese, MD; Hansen, NP; Mills,  NP; Parrish, NP; Smoot, NP; Summer, MD; Vapne, MD [address] [place], [place] [address] [phone] Mon-Fri 8:30-5:00, Sat 10:00-1:00 Providers come to see babies at [facility] Does NOT accept Medicaid Free prenatal information session Tuesdays at 4:45pm [facility] Bouska, MD; Gordon, PA; Jeffery, PA; Weber, PA [address] [place] [address] [phone] Mon-Fri 7:30-5:30 Babies seen by [facility] providers [facility] [address], [place], [place]  [address] [phone]   Fax - [phone]  [place] ([address] & [address]) [facility] [place] [address] [place], [place] [address] [phone] Mon-Thur 8:00-6:00 Providers come to see babies at [facility] Accepting Medicaid [facility] Anderson, NP; Badger, MD; Beal, PA; Spencer, PA [address] [place], [place] [address] [phone] Mon-Thur 7:30-7:30, Fri 7:30-4:30 Babies seen by [facility] providers Accepting Medicaid [facility] Agbuya, MD; Klett, NP; Romgoolam, MD [address], [place], [place] [address] [phone] Mon-Fri 8:30-5:00, Sat 8:30-12:00 Providers come to see babies at [facility] Accepting Medicaid Must have "Meet & Greet" appointment at office prior to delivery [facility] ([facility]) McCord, MD; Wallace, MD; Wood, MD [address], [place], [place] [address] [phone] Mon-Wed 8:00-6:00, Thur-Fri 8:00-5:00, Sat 9:00-12:00 Providers come to see babies at [facility] Does NOT accept Medicaid Only accepting siblings of current patients [facility]  [address], [place], [place]  [address] [phone]   Fax - [phone] [facility] [address], [place], [place]   [address] [phone]   Fax - [phone]  [place] ([address] & [address]) [facility] Cirigliano, DO; Matthews, DO [address] , [place] [address] [phone] Mon-Fri 7:00-5:00 Babies seen by [facility] providers Does NOT accept Medicaid [facility] Briscoe, MD; Howley, PA; Moreira, PA [address], [place],  [address] [phone] Mon-Fri 8:00-5:00 Babies seen by [facility] providers Accepting Medicaid [facility] Boyd, MD; Church, PA; Jones, NP; Osborn, PA [address],   [place], [place] [address] [phone] Mon-Fri 8:00-5:00 Babies seen by providers at [facility] Accepting Medicaid  [place] ([address]) [facility] [place] Wendling, DO [address] [place], [place] [address] [phone] Mon-Fri 8:00-5:00 Babies seen by [facility] providers Does NOT accept Medicaid Limited availability, please call early in hospitalization to schedule follow-up [facility] Calderon, PA; Cummings, MD; Dillard, MD; Martin, PA; Olson, MD; VanDeven, PA [address], [place], [place] [address] [phone] Mon-Fri 8:30-5:00, Sat 9:00-12:00 Babies seen by providers at [facility] Accepting Medicaid Please register online then schedule online or call office [email or web address] ([facility] Hunter, NP; Kumar, MD; Martin Rogers, PA [address], [place], [place] [address] [phone] Mon-Fri 8:00-5:00 Babies seen by providers at [facility] Accepting Medicaid [facility] ([facility]) Bennington, MD; Kristi Fleenor, NP; West, MD [address], [place], [place] [address] [phone] Mon-Fri 8:00-5:30, Sat&Sun by  appointment (phones open at 8:30) Babies seen by [facility] providers Accepting Medicaid Must be a first-time baby or sibling of current patient [facility] [place]  [address], [place], [place]  [address] [phone]   Fax - [phone]  [place] ([address]) [facility] Brown, PA; Cowen, PA; Rice, MD; Helton, PA; Spry, MD [address] [place], [place] [address] [phone] Mon-Thur 8:00-7:00, Fri 8:00-5:00, Sat 8:00-12:00, Sun 9:00-12:00 Babies seen by [facility] providers Accepting Medicaid [facility] [place] Coe-Goins, MD; Marshall, MD; Pierre-Louis, MD [address] [place], [place], [place] [address] [phone] Mon-Thur 8:00-5:00 Babies seen by providers at [facility] Accepting Medicaid [facility] Bratton, MD; Coe-Goins, MD; Hayes, MD; Lewis, MD; List, MD; Lott, MD; Marshall, MD; Moran, MD; O'Neal, MD; Pierre-Louis, MD; Pitonzo, MD; Scholer, MD; Spangle, MD [address] [place], [place] [address] [phone] Mon-Fri 8:00-5:30, Sat (Oct.-Mar.) 9:00-1:00 Babies seen by providers at [facility] Accepting Medicaid Must fill out new patient packet, available online at [email or web address] [facility] ([facility]) Friddle, NP; Harris, NP; Kelly, NP; Logan, MD; Melvin, PA; Poth, MD; Ramadoss, MD; Stanton, NP [address], [place], [place] [address] [phone] Mon-Thur 8:00-5:30, Fri 8:00-5:00 Babies seen by providers at [facility] Accepting Medicaid  [place] ([address]) [facility] Dixon, PA; Wynants[place], MD; Pickard, MD; Tapia, PA [address], [place], [place] [address] [phone] Mon-Fri 8:00-5:00 Babies seen by providers at [facility] Accepting Medicaid   [place] ([address]) [facility] [place] Masneri, DO; Meyers, MD; Nelson, PA [address], [place], [place] [address] [phone] Mon-Fri 8:00-5:00 Babies seen by providers at [facility] Does NOT accept Medicaid Limited appointment availability, please call early in hospitalization  [facility] [place] Kunedd, DO; McGowen, MD [address], [place], [place] [address] [phone] Mon-Fri 8:00-5:00 Babies seen by [facility] providers Does NOT accept Medicaid [facility] [place] Cameron, MD; MacDonald, MD; Michaels, PA; Nayak, MD [address] [place] [address], [place], [place] [address] [phone] Mon-Fri 8:00-5:00 After hours clinic ([address] [place], [place] [address]) [phone] Mon-Fri 5:00-8:00, Sat 12:00-6:00, Sun 10:00-4:00 Babies seen by [facility] providers Accepting Medicaid [facility] [place] [address], [place], [place]  [address] [phone]   Fax - [phone]  [place] ([address]) [facility] [place] Andy, MD [address], [place], [place] [address] [phone] Mon-Fri 8:00-5:00 Babies seen by [facility] providers Does NOT accept Medicaid [facility] [place] ([facility] [place]) Eksir, MD [address], [place], [place] [address] [phone] Mon-Thur 8:00-7:00, Fri 8:00-5:00, Sat 8:00-12:00 Babies seen by providers at [facility] Accepting Medicaid - but does not have vaccinations in office (must be received elsewhere) Limited availability, please call early in hospitalization  [place] ([address]) [facility]  Charlene Flemming, MD [address], [place] [address] [phone]  Fax [phone]  [place] [facility]  Kimberly Newton, MD, Annamarie Streilein, PA, Carla Hampton, PA [address] [place] [place], [place] [address] [phone] [facility]  [address], [place], [place] [address] [phone] [address], [place], [place] [address] [phone] (West Office)  [facility] [address], [place], [place] [address] [phone] [facility] [address] [place], [place], [place] [address] [phone] [facility] [address], [place], [place] [address] [phone] [facility] [address], [place], [place] [address] [phone] [facility] [address], [place], [place] [address] [phone] [facility] [address], [place], [place] [address] [phone] [facility]  [address], [place], [place] [address] [phone] Dr[facility] [address] [place] [address], [place], [place] [address] [phone] [place] [facility] [address], [place], [place] [address] [phone] [facility] [address], , [place] [address] [phone]  

## 2023-01-17 NOTE — Progress Notes (Signed)
   PRENATAL VISIT NOTE  Subjective:  Vicki Cline is a 27 y.o. G2P0010 at [redacted]w[redacted]d being seen today for ongoing prenatal care.  She is currently monitored for the following issues for this low-risk pregnancy and has Supervision of other normal pregnancy, antepartum and ASCUS of cervix with negative high risk HPV on their problem list.  Patient reports no complaints.  Contractions: Not present. Vag. Bleeding: None.  Movement: Absent. Denies leaking of fluid.   The following portions of the patient's history were reviewed and updated as appropriate: allergies, current medications, past family history, past medical history, past social history, past surgical history and problem list.   Objective:   Vitals:   01/17/23 0947  BP: 109/70  Pulse: 94  Weight: 173 lb (78.5 kg)    Fetal Status: Fetal Heart Rate (bpm): 155 Fundal Height: 20 cm Movement: Absent     General:  Alert, oriented and cooperative. Patient is in no acute distress.  Skin: Skin is warm and dry. No rash noted.   Cardiovascular: Normal heart rate noted  Respiratory: Normal respiratory effort, no problems with respiration noted  Abdomen: Soft, gravid, appropriate for gestational age.  Pain/Pressure: Present     Pelvic: Cervical exam deferred        Extremities: Normal range of motion.  Edema: None  Mental Status: Normal mood and affect. Normal behavior. Normal judgment and thought content.   Assessment and Plan:  Pregnancy: G2P0010 at [redacted]w[redacted]d 1. Supervision of other normal pregnancy, antepartum  - AFP, Serum, Open Spina Bifida  Preterm labor symptoms and general obstetric precautions including but not limited to vaginal bleeding, contractions, leaking of fluid and fetal movement were reviewed in detail with the patient. Please refer to After Visit Summary for other counseling recommendations.   Return in about 4 weeks (around 02/14/2023) for ROB.  Future Appointments  Date Time Provider [place]   02/17/2023  8:15 AM Helaine Chess [facility]  03/06/2023  8:15 AM Deloris Ping, CNM [facility]  03/06/2023  8:50 AM [facility] [place], [place]

## 2023-01-19 LAB — AFP, SERUM, OPEN SPINA BIFIDA
AFP MoM: 0.78
AFP Value: 44.9 ng/mL
Gest. Age on Collection Date: 20 weeks
Maternal Age At EDD: 25.8 yr
OSBR Risk 1 IN: 10000
Test Results:: NEGATIVE
Weight: 173 [lb_av]

## 2023-02-14 ENCOUNTER — Encounter: Payer: BC Managed Care – PPO | Admitting: Advanced Practice Midwife

## 2023-02-17 ENCOUNTER — Ambulatory Visit (INDEPENDENT_AMBULATORY_CARE_PROVIDER_SITE_OTHER): Payer: BC Managed Care – PPO | Admitting: Certified Nurse Midwife

## 2023-02-17 ENCOUNTER — Other Ambulatory Visit: Payer: Self-pay

## 2023-02-17 VITALS — BP 109/70 | HR 80 | Wt 182.0 lb

## 2023-02-17 DIAGNOSIS — M533 Sacrococcygeal disorders, not elsewhere classified: Secondary | ICD-10-CM

## 2023-02-17 DIAGNOSIS — Z3A24 24 weeks gestation of pregnancy: Secondary | ICD-10-CM

## 2023-02-17 DIAGNOSIS — Z3492 Encounter for supervision of normal pregnancy, unspecified, second trimester: Secondary | ICD-10-CM

## 2023-02-17 NOTE — Progress Notes (Signed)
   PRENATAL VISIT NOTE  Subjective:  Vicki Cline is a 27 y.o. G2P0010 at 52w3dbeing seen today for ongoing prenatal care.  She is currently monitored for the following issues for this low-risk pregnancy and has Supervision of other normal pregnancy, antepartum and ASCUS of cervix with negative high risk HPV on their problem list.  Patient reports no complaints and having some tailbone pain if she sits on a hard surface for too long then stands up. Fell down in early pregnancy, hitting straight on her butt pretty hard. Denies tenderness to palpation or any lumps/warmth .  Contractions: Not present. Vag. Bleeding: None.  Movement: Present. Denies leaking of fluid.   The following portions of the patient's history were reviewed and updated as appropriate: allergies, current medications, past family history, past medical history, past social history, past surgical history and problem list.   Objective:   Vitals:   02/17/23 0830  BP: 109/70  Pulse: 80  Weight: 182 lb (82.6 kg)    Fetal Status: Fetal Heart Rate (bpm): 150 Fundal Height: 24 cm Movement: Present     General:  Alert, oriented and cooperative. Patient is in no acute distress.  Skin: Skin is warm and dry. No rash noted.   Cardiovascular: Normal heart rate noted  Respiratory: Normal respiratory effort, no problems with respiration noted  Abdomen: Soft, gravid, appropriate for gestational age.  Pain/Pressure: Present     Pelvic: Cervical exam deferred        Extremities: Normal range of motion.  Edema: None  Mental Status: Normal mood and affect. Normal behavior. Normal judgment and thought content.   Assessment and Plan:  Pregnancy: G2P0010 at 266w3d. Encounter for supervision of low-risk pregnancy in second trimester - Doing well, feeling regular and vigorous fetal movement   2. [redacted] weeks gestation of pregnancy - Routine OB care with anticipatory guidance re GTT at next visit  3. Coccydynia - Encouraged  stretches to relieve coccyx pain and epsom salt baths - If it does not resolve or gets worse, we can refer to PT or chiro  Preterm labor symptoms and general obstetric precautions including but not limited to vaginal bleeding, contractions, leaking of fluid and fetal movement were reviewed in detail with the patient. Please refer to After Visit Summary for other counseling recommendations.   Return in about 2 weeks (around 03/03/2023) for IN-PERSON, LOB/GTT.  Future Appointments  Date Time Provider [place]3/18/2024  8:15 AM [facility]3/18/2024  8:50 AM [facility]   JaGabriel CarinaCNM

## 2023-03-03 ENCOUNTER — Other Ambulatory Visit: Payer: Self-pay

## 2023-03-03 DIAGNOSIS — Z3480 Encounter for supervision of other normal pregnancy, unspecified trimester: Secondary | ICD-10-CM

## 2023-03-06 ENCOUNTER — Encounter: Payer: Self-pay | Admitting: Certified Nurse Midwife

## 2023-03-06 ENCOUNTER — Other Ambulatory Visit: Payer: Self-pay

## 2023-03-06 ENCOUNTER — Other Ambulatory Visit: Payer: BC Managed Care – PPO

## 2023-03-06 ENCOUNTER — Ambulatory Visit (INDEPENDENT_AMBULATORY_CARE_PROVIDER_SITE_OTHER): Payer: BC Managed Care – PPO | Admitting: Certified Nurse Midwife

## 2023-03-06 VITALS — BP 117/68 | HR 87 | Wt 183.3 lb

## 2023-03-06 DIAGNOSIS — Z3A26 26 weeks gestation of pregnancy: Secondary | ICD-10-CM

## 2023-03-06 DIAGNOSIS — Z3480 Encounter for supervision of other normal pregnancy, unspecified trimester: Secondary | ICD-10-CM

## 2023-03-06 DIAGNOSIS — Z3492 Encounter for supervision of normal pregnancy, unspecified, second trimester: Secondary | ICD-10-CM

## 2023-03-06 NOTE — Progress Notes (Signed)
   PRENATAL VISIT NOTE  Subjective:  Vicki Cline is a 27 y.o. G2P0010 at [redacted]w[redacted]d being seen today for ongoing prenatal care.  She is currently monitored for the following issues for this low-risk pregnancy and has Supervision of other normal pregnancy, antepartum and ASCUS of cervix with negative high risk HPV on their problem list.  Patient reports no complaints She does desire to keep her placenta and has concerns surrounding the process.  Contractions: Not present. Vag. Bleeding: None.  Movement: Present. Denies leaking of fluid.   The following portions of the patient's history were reviewed and updated as appropriate: allergies, current medications, past family history, past medical history, past social history, past surgical history and problem list.   Objective:   Vitals:   03/06/23 0911  BP: 117/68  Pulse: 87  Weight: 183 lb 4.8 oz (83.1 kg)    Fetal Status: Fetal Heart Rate (bpm): 151 Fundal Height: 27 cm Movement: Present     General:  Alert, oriented and cooperative. Patient is in no acute distress.  Skin: Skin is warm and dry. No rash noted.   Cardiovascular: Normal heart rate noted  Respiratory: Normal respiratory effort, no problems with respiration noted  Abdomen: Soft, gravid, appropriate for gestational age.  Pain/Pressure: Absent     Pelvic: Cervical exam deferred        Extremities: Normal range of motion.  Edema: Trace  Mental Status: Normal mood and affect. Normal behavior. Normal judgment and thought content.   Assessment and Plan:  Pregnancy: G2P0010 at [redacted]w[redacted]d 1. Encounter for supervision of low-risk pregnancy in second trimester - Patient feeling frequent and vigorous fetal movement.  - Patient graduating on May 2nd and hopes that she remains pregnant.    2. [redacted] weeks gestation of pregnancy - GTT Today - Patient FOB desires to keep placenta after birth and dispose of on their own. Reviewed that patient needs to sign paperwork to keep  placenta. Discussed that she will need a cooler to keep it and need to take it home prior to moving to mother baby.  - Patient agreeable to plan of care.   Preterm labor symptoms and general obstetric precautions including but not limited to vaginal bleeding, contractions, leaking of fluid and fetal movement were reviewed in detail with the patient. Please refer to After Visit Summary for other counseling recommendations.   Return in about 4 weeks (around 04/03/2023).  Future Appointments  Date Time Provider [place]  04/07/2023  9:35 AM Vicki Cline [facility]  04/21/2023  8:15 AM Vicki Cline, CNM [facility]    Vicki Cline) Vicki Rotunda, MSN, CNM  Center for [facility]  03/06/23 2:12 PM

## 2023-03-06 NOTE — Progress Notes (Signed)
Pt advised that she would like to keep her Placenta after Water Birth.

## 2023-03-07 LAB — RPR: RPR Ser Ql: NONREACTIVE

## 2023-03-07 LAB — CBC
Hematocrit: 33.3 % — ABNORMAL LOW (ref 34.0–46.6)
Hemoglobin: 10.8 g/dL — ABNORMAL LOW (ref 11.1–15.9)
MCH: 29.6 pg (ref 26.6–33.0)
MCHC: 32.4 g/dL (ref 31.5–35.7)
MCV: 91 fL (ref 79–97)
Platelets: 239 10*3/uL (ref 150–450)
RBC: 3.65 x10E6/uL — ABNORMAL LOW (ref 3.77–5.28)
RDW: 12.3 % (ref 11.7–15.4)
WBC: 6.5 10*3/uL (ref 3.4–10.8)

## 2023-03-07 LAB — HIV ANTIBODY (ROUTINE TESTING W REFLEX): HIV Screen 4th Generation wRfx: NONREACTIVE

## 2023-03-07 LAB — GLUCOSE TOLERANCE, 2 HOURS W/ 1HR
Glucose, 1 hour: 91 mg/dL (ref 70–179)
Glucose, 2 hour: 90 mg/dL (ref 70–152)
Glucose, Fasting: 77 mg/dL (ref 70–91)

## 2023-03-16 ENCOUNTER — Encounter: Payer: Self-pay | Admitting: *Deleted

## 2023-04-07 ENCOUNTER — Ambulatory Visit (INDEPENDENT_AMBULATORY_CARE_PROVIDER_SITE_OTHER): Payer: BC Managed Care – PPO | Admitting: Certified Nurse Midwife

## 2023-04-07 ENCOUNTER — Other Ambulatory Visit: Payer: Self-pay

## 2023-04-07 VITALS — BP 109/74 | HR 88 | Wt 192.0 lb

## 2023-04-07 DIAGNOSIS — Z3A31 31 weeks gestation of pregnancy: Secondary | ICD-10-CM

## 2023-04-07 DIAGNOSIS — Z3403 Encounter for supervision of normal first pregnancy, third trimester: Secondary | ICD-10-CM

## 2023-04-10 NOTE — Progress Notes (Signed)
   PRENATAL VISIT NOTE  Subjective:  Vicki Cline is a 27 y.o. G2P0010 at [redacted]w[redacted]d being seen today for ongoing prenatal care.  She is currently monitored for the following issues for this low-risk pregnancy and has Supervision of other normal pregnancy, antepartum and ASCUS of cervix with negative high risk HPV on their problem list.  Patient reports no complaints.  Contractions: Irritability. Vag. Bleeding: None.  Movement: Present. Denies leaking of fluid.   The following portions of the patient's history were reviewed and updated as appropriate: allergies, current medications, past family history, past medical history, past social history, past surgical history and problem list.   Objective:   Vitals:   04/07/23 0948  BP: 109/74  Pulse: 88  Weight: 192 lb (87.1 kg)    Fetal Status: Fetal Heart Rate (bpm): 152 Fundal Height: 31 cm Movement: Present     General:  Alert, oriented and cooperative. Patient is in no acute distress.  Skin: Skin is warm and dry. No rash noted.   Cardiovascular: Normal heart rate noted  Respiratory: Normal respiratory effort, no problems with respiration noted  Abdomen: Soft, gravid, appropriate for gestational age.  Pain/Pressure: Present     Pelvic: Cervical exam deferred        Extremities: Normal range of motion.  Edema: None  Mental Status: Normal mood and affect. Normal behavior. Normal judgment and thought content.   Assessment and Plan:  Pregnancy: G2P0010 at [redacted]w[redacted]d 1. Encounter for supervision of low-risk first pregnancy in third trimester - Patient feeling vigorous and frequent fetal movement.  - Transverse by Leopolds today   2. [redacted] weeks gestation of pregnancy - 3rd trimester expectations reviewed.  - Tdap at next visit.   Preterm labor symptoms and general obstetric precautions including but not limited to vaginal bleeding, contractions, leaking of fluid and fetal movement were reviewed in detail with the patient. Please  refer to After Visit Summary for other counseling recommendations.   Return in about 2 weeks (around 04/21/2023) for LOB.  Future Appointments  Date Time Provider Department Center  04/21/2023  8:15 AM Sissy Hoff [facility]  05/05/2023  9:35 AM Carlynn Herald, CNM [facility]  05/19/2023  9:35 AM Carlynn Herald, CNM [facility]    Divinity Kyler Danella Deis) Suzie Portela, MSN, CNM  Center for [facility]  04/10/23 2:56 AM

## 2023-04-16 DIAGNOSIS — H5213 Myopia, bilateral: Secondary | ICD-10-CM | POA: Diagnosis not present

## 2023-04-21 ENCOUNTER — Other Ambulatory Visit: Payer: Self-pay

## 2023-04-21 ENCOUNTER — Ambulatory Visit (INDEPENDENT_AMBULATORY_CARE_PROVIDER_SITE_OTHER): Payer: BC Managed Care – PPO | Admitting: Certified Nurse Midwife

## 2023-04-21 VITALS — BP 117/74 | HR 86 | Wt 192.0 lb

## 2023-04-21 DIAGNOSIS — Z3A33 33 weeks gestation of pregnancy: Secondary | ICD-10-CM

## 2023-04-21 DIAGNOSIS — M5432 Sciatica, left side: Secondary | ICD-10-CM

## 2023-04-21 DIAGNOSIS — Z3403 Encounter for supervision of normal first pregnancy, third trimester: Secondary | ICD-10-CM

## 2023-04-21 NOTE — Progress Notes (Signed)
   PRENATAL VISIT NOTE  Subjective:  Vicki Cline is a 27 y.o. G2P0010 at [redacted]w[redacted]d being seen today for ongoing prenatal care.  She is currently monitored for the following issues for this low-risk pregnancy and has Supervision of other normal pregnancy, antepartum and ASCUS of cervix with negative high risk HPV on their problem list.  Patient reports backache.  Contractions: Irritability. Vag. Bleeding: None.  Movement: Present. Denies leaking of fluid.   The following portions of the patient's history were reviewed and updated as appropriate: allergies, current medications, past family history, past medical history, past social history, past surgical history and problem list.   Objective:   Vitals:   04/21/23 0824  BP: 117/74  Pulse: 86  Weight: 192 lb (87.1 kg)    Fetal Status: Fetal Heart Rate (bpm): 143 Fundal Height: 33 cm Movement: Present     General:  Alert, oriented and cooperative. Patient is in no acute distress.  Skin: Skin is warm and dry. No rash noted.   Cardiovascular: Normal heart rate noted  Respiratory: Normal respiratory effort, no problems with respiration noted  Abdomen: Soft, gravid, appropriate for gestational age.  Pain/Pressure: Present     Pelvic: Cervical exam deferred        Extremities: Normal range of motion.  Edema: None  Mental Status: Normal mood and affect. Normal behavior. Normal judgment and thought content.   Assessment and Plan:  Pregnancy: G2P0010 at [redacted]w[redacted]d 1. Encounter for supervision of low-risk first pregnancy in third trimester - Patient doing well. She reports feeling frequent and vigorous fetal movement.  - FOB present for visit today and supportive.   2. [redacted] weeks gestation of pregnancy - Size equivalent to dates.  - Previously transverse via Leopolds, Vertex with fetal head to maternal Right   3. Sciatica of left side - Reviewed normal discomforts of pregnancy, and encouraged the use of a maternity support belt.  -  She reports she has been using [facility] and reports good relief.   Preterm labor symptoms and general obstetric precautions including but not limited to vaginal bleeding, contractions, leaking of fluid and fetal movement were reviewed in detail with the patient. Please refer to After Visit Summary for other counseling recommendations.   Return in about 2 weeks (around 05/05/2023) for LOB.  Future Appointments  Date Time Provider Department Center  05/05/2023  9:35 AM Sissy Hoff [facility]  05/16/2023  9:15 AM Lavonda Jumbo, DO [facility]  05/24/2023  2:35 PM Bernerd Limbo, CNM [facility]    Ernestine Rohman Danella Deis) Suzie Portela, MSN, CNM  Center for [facility]  04/21/23 9:53 AM

## 2023-05-05 ENCOUNTER — Other Ambulatory Visit: Payer: Self-pay

## 2023-05-05 ENCOUNTER — Ambulatory Visit (INDEPENDENT_AMBULATORY_CARE_PROVIDER_SITE_OTHER): Payer: BC Managed Care – PPO | Admitting: Certified Nurse Midwife

## 2023-05-05 VITALS — BP 108/82 | HR 92 | Wt 192.1 lb

## 2023-05-05 DIAGNOSIS — Z3403 Encounter for supervision of normal first pregnancy, third trimester: Secondary | ICD-10-CM

## 2023-05-05 DIAGNOSIS — Z3480 Encounter for supervision of other normal pregnancy, unspecified trimester: Secondary | ICD-10-CM

## 2023-05-05 DIAGNOSIS — R040 Epistaxis: Secondary | ICD-10-CM

## 2023-05-05 DIAGNOSIS — Z3A35 35 weeks gestation of pregnancy: Secondary | ICD-10-CM

## 2023-05-05 NOTE — Patient Instructions (Signed)
[id number]  [facility]  Ear Nose and Throat

## 2023-05-06 LAB — CBC
Hematocrit: 35.3 % (ref 34.0–46.6)
Hemoglobin: 11.5 g/dL (ref 11.1–15.9)
MCH: 29.1 pg (ref 26.6–33.0)
MCHC: 32.6 g/dL (ref 31.5–35.7)
MCV: 89 fL (ref 79–97)
Platelets: 192 10*3/uL (ref 150–450)
RBC: 3.95 x10E6/uL (ref 3.77–5.28)
RDW: 12.2 % (ref 11.7–15.4)
WBC: 6.2 10*3/uL (ref 3.4–10.8)

## 2023-05-06 LAB — VON WILLEBRAND PANEL
Factor VIII Activity: 214 % — ABNORMAL HIGH (ref 56–140)
Von Willebrand Ag: 311 % — ABNORMAL HIGH (ref 50–200)
Von Willebrand Factor: 178 % (ref 50–200)

## 2023-05-06 LAB — COAG STUDIES INTERP REPORT

## 2023-05-07 NOTE — Progress Notes (Signed)
   PRENATAL VISIT NOTE  Subjective:  Vicki Cline is a 27 y.o. G2P0010 at [redacted]w[redacted]d being seen today for ongoing prenatal care.  She is currently monitored for the following issues for this low-risk pregnancy and has Supervision of other normal pregnancy, antepartum and ASCUS of cervix with negative high risk HPV on their problem list.  Patient reports  frequent nose bleeds. Patient reports having nosebleeds daily lasting 30-45 mins per session. She reports that she has been having them since she get pregnant but states in 3rd trimester they have gotten longer and more frequent  .  Contractions: Not present. Vag. Bleeding: None.  Movement: Present. Denies leaking of fluid.   The following portions of the patient's history were reviewed and updated as appropriate: allergies, current medications, past family history, past medical history, past social history, past surgical history and problem list.   Objective:   Vitals:   05/05/23 0953  BP: 108/82  Pulse: 92  Weight: 192 lb 1.6 oz (87.1 kg)    Fetal Status: Fetal Heart Rate (bpm): 140   Movement: Present     General:  Alert, oriented and cooperative. Patient is in no acute distress.  Skin: Skin is warm and dry. No rash noted.   Cardiovascular: Normal heart rate noted  Respiratory: Normal respiratory effort, no problems with respiration noted  Abdomen: Soft, gravid, appropriate for gestational age.  Pain/Pressure: Absent     Pelvic: Cervical exam deferred        Extremities: Normal range of motion.  Edema: None  Mental Status: Normal mood and affect. Normal behavior. Normal judgment and thought content.   Assessment and Plan:  Pregnancy: G2P0010 at [redacted]w[redacted]d 1. Encounter for supervision of low-risk first pregnancy in third trimester - Patient overall doing well. She reports frequent and vigorous fetal movement.  2. [redacted] weeks gestation of pregnancy - GBS collection at next visit   3. Supervision of other normal pregnancy,  antepartum   4. Frequent nosebleeds - Consulted Dr. Crissie Reese on the necessity of a VWB panel in light of pregnancy. Per MD collect panel and make a referral to ENT.  - CBC - Von Willebrand panel - Ambulatory referral to ENT  Preterm labor symptoms and general obstetric precautions including but not limited to vaginal bleeding, contractions, leaking of fluid and fetal movement were reviewed in detail with the patient. Please refer to After Visit Summary for other counseling recommendations.   Return in about 1 week (around 05/12/2023) for LOB w GBS.  Future Appointments  Date Time Provider Department Center  05/16/2023  9:15 AM Lavonda Jumbo, DO [facility]  05/24/2023  2:35 PM Bernerd Limbo, CNM [facility]    Barnaby Rippeon Danella Deis) Suzie Portela, MSN, CNM  Center for [facility]  05/07/23 11:08 PM

## 2023-05-16 ENCOUNTER — Other Ambulatory Visit (HOSPITAL_COMMUNITY)
Admission: RE | Admit: 2023-05-16 | Discharge: 2023-05-16 | Disposition: A | Discharge status: Home / Self Care | Payer: BC Managed Care – PPO | Source: Ambulatory Visit

## 2023-05-16 ENCOUNTER — Inpatient Hospital Stay (HOSPITAL_COMMUNITY)
Admission: AD | Admit: 2023-05-16 | Discharge: 2023-05-17 | Disposition: A | Discharge status: Home / Self Care | Payer: BC Managed Care – PPO | Attending: Obstetrics and Gynecology | Admitting: Obstetrics and Gynecology

## 2023-05-16 ENCOUNTER — Encounter (HOSPITAL_COMMUNITY): Payer: Self-pay | Admitting: Obstetrics and Gynecology

## 2023-05-16 ENCOUNTER — Other Ambulatory Visit: Payer: Self-pay

## 2023-05-16 ENCOUNTER — Ambulatory Visit (INDEPENDENT_AMBULATORY_CARE_PROVIDER_SITE_OTHER): Payer: BC Managed Care – PPO | Admitting: Family Medicine

## 2023-05-16 VITALS — BP 129/83 | HR 88

## 2023-05-16 DIAGNOSIS — O471 False labor at or after 37 completed weeks of gestation: Secondary | ICD-10-CM | POA: Diagnosis not present

## 2023-05-16 DIAGNOSIS — Z0371 Encounter for suspected problem with amniotic cavity and membrane ruled out: Secondary | ICD-10-CM | POA: Insufficient documentation

## 2023-05-16 DIAGNOSIS — Z3A37 37 weeks gestation of pregnancy: Secondary | ICD-10-CM

## 2023-05-16 DIAGNOSIS — Z3483 Encounter for supervision of other normal pregnancy, third trimester: Secondary | ICD-10-CM

## 2023-05-16 DIAGNOSIS — Z3480 Encounter for supervision of other normal pregnancy, unspecified trimester: Secondary | ICD-10-CM

## 2023-05-16 DIAGNOSIS — Z3493 Encounter for supervision of normal pregnancy, unspecified, third trimester: Secondary | ICD-10-CM

## 2023-05-16 DIAGNOSIS — Z3689 Encounter for other specified antenatal screening: Secondary | ICD-10-CM

## 2023-05-16 LAB — POCT FERN TEST: POCT Fern Test: NEGATIVE

## 2023-05-16 NOTE — MAU Note (Signed)
.  Vicki Cline is a 27 y.o. at [redacted]w[redacted]d here in MAU reporting having strong ctxs since 2100. Has leaked fld intermittently since this am. Had an appt and states told them but nothing else was said about fld. 3.5cm at office. Reports good FM and denies VB. Fld is clear  Onset of complaint: 2100 Pain score: 6 Vitals:   05/16/23 2308 05/16/23 2309  BP:  125/76  Pulse: 90   Resp: 17   Temp: (!) 97.3 F (36.3 C)   SpO2: 99%      FHT:139 Lab orders placed from triage:  labor eva

## 2023-05-16 NOTE — Progress Notes (Signed)
   PRENATAL VISIT NOTE  Subjective:  Vicki Cline is a 27 y.o. G2P0010 at [redacted]w[redacted]d being seen today for ongoing prenatal care.  She is currently monitored for the following issues for this low-risk pregnancy and has Supervision of other normal pregnancy, antepartum and ASCUS of cervix with negative high risk HPV on their problem list.  Patient reports no complaints.  Contractions: Irritability. Vag. Bleeding: None.  Movement: Present. Denies leaking of fluid.   The following portions of the patient's history were reviewed and updated as appropriate: allergies, current medications, past family history, past medical history, past social history, past surgical history and problem list.   Objective:   Vitals:   05/16/23 0959  BP: 129/83  Pulse: 88    Fetal Status: Fetal Heart Rate (bpm): 143   Movement: Present     General:  Alert, oriented and cooperative. Patient is in no acute distress.  Skin: Skin is warm and dry. No rash noted.   Cardiovascular: Normal heart rate noted  Respiratory: Normal respiratory effort, no problems with respiration noted  Abdomen: Soft, gravid, appropriate for gestational age.  Pain/Pressure: Present     Pelvic: Cervical exam performed in the presence of a chaperone Dilation: 3.5 Effacement (%): 80 Station: -2  Extremities: Normal range of motion.  Edema: Trace  Mental Status: Normal mood and affect. Normal behavior. Normal judgment and thought content.   Assessment and Plan:  Pregnancy: G2P0010 at [redacted]w[redacted]d 1. Supervision of other normal pregnancy, antepartum No acute concerns. Reviewed anatomy US and labs.  - Culture, beta strep (group b only) - GC/Chlamydia probe amp ([place])not at [facility]  2. [redacted] weeks gestation of pregnancy -Follow up in 1 week or sooner if needed  Term labor symptoms and general obstetric precautions including but not limited to vaginal bleeding, contractions, leaking of fluid and fetal movement were reviewed in detail  with the patient. Please refer to After Visit Summary for other counseling recommendations.   No follow-ups on file.  Future Appointments  Date Time Provider Department Center  05/24/2023  2:35 PM Osborne Oman [facility]    Kamalani Mastro Autry-Lott, DO

## 2023-05-17 DIAGNOSIS — Z3689 Encounter for other specified antenatal screening: Secondary | ICD-10-CM

## 2023-05-17 DIAGNOSIS — Z3A37 37 weeks gestation of pregnancy: Secondary | ICD-10-CM

## 2023-05-17 DIAGNOSIS — Z0371 Encounter for suspected problem with amniotic cavity and membrane ruled out: Secondary | ICD-10-CM | POA: Diagnosis not present

## 2023-05-17 LAB — GC/CHLAMYDIA PROBE AMP (~~LOC~~) NOT AT ARMC
Chlamydia: NEGATIVE
Comment: NEGATIVE
Comment: NORMAL
Neisseria Gonorrhea: NEGATIVE

## 2023-05-17 LAB — POCT FERN TEST: POCT Fern Test: NEGATIVE

## 2023-05-17 MED ORDER — ZOLPIDEM TARTRATE 5 MG PO TABS: 5.0000 mg | ORAL_TABLET | Freq: Every evening | ORAL | 0 refills | Status: DC | PRN

## 2023-05-17 NOTE — MAU Provider Note (Signed)
Event Date/Time   First Provider Initiated Contact with Patient 05/16/23 2345       S: Ms. Vicki Cline is a 27 y.o. G2P0010 at [redacted]w[redacted]d  who presents to MAU today complaining of leaking of fluid since early this morning 05/16/2023. She endorses recurrent painful contractions since 2100. She denies vaginal bleeding. She she reports normal fetal movement.    O: BP 126/72   Pulse 91   Temp (!) 97.3 F (36.3 C)   Resp 17   Ht 5\' 5"  (1.651 m)   Wt 89.8 kg   LMP 08/31/2022   SpO2 97%   BMI 32.95 kg/m  GENERAL: Well-developed, well-nourished female in no acute distress.  HEAD: Normocephalic, atraumatic.  CHEST: Normal effort of breathing, regular heart rate ABDOMEN: Soft, nontender, gravid PELVIC: Normal external female genitalia. Vagina is pink and rugated. Cervix with normal contour, no lesions. Normal discharge.  Negative pooling.   Cervical exam:  Dilation: 2.5-3 Effacement (%): 50 Station: -2 Presentation: Vertex Exam by:: Thalia Bloodgood, CNM   Fetal Monitoring: Baseline: 140 Variability: Mod Accelerations: 15 x 15 Decelerations: None Contractions: q 2-4 min  Results for orders placed or performed during the hospital encounter of 05/16/23 (from the past 24 hour(s))  POCT fern test     Status: None   Collection Time: 05/16/23 11:25 PM  Result Value Ref Range   POCT Fern Test Negative = intact amniotic membranes   Fern Test     Status: None   Collection Time: 05/17/23 12:10 AM  Result Value Ref Range   POCT Fern Test Negative = intact amniotic membranes      A: SIUP at [redacted]w[redacted]d  Intact membranes (negative pooling, negative gush with Valsalva, negative fern) Cat I tracing Patient declines recheck of cervix in one hour  P: Discharge home in stable condition  Meds ordered this encounter  Medications   zolpidem (AMBIEN) 5 MG tablet    Sig: Take 1 tablet (5 mg total) by mouth at bedtime as needed for up to 10 doses for sleep.    Dispense:  10 tablet     Refill:  0    Order Specific Question:   Supervising Provider    Answer:   CONSTANT, PEGGY [[id number]]     Clayton Bibles, CNM 05/17/2023 2:01 AM

## 2023-05-19 ENCOUNTER — Encounter: Payer: Self-pay | Admitting: Certified Nurse Midwife

## 2023-05-20 LAB — CULTURE, BETA STREP (GROUP B ONLY): Strep Gp B Culture: NEGATIVE

## 2023-05-24 ENCOUNTER — Ambulatory Visit (INDEPENDENT_AMBULATORY_CARE_PROVIDER_SITE_OTHER): Payer: BC Managed Care – PPO | Admitting: Certified Nurse Midwife

## 2023-05-24 ENCOUNTER — Other Ambulatory Visit: Payer: Self-pay

## 2023-05-24 ENCOUNTER — Other Ambulatory Visit (HOSPITAL_COMMUNITY)
Admission: RE | Admit: 2023-05-24 | Discharge: 2023-05-24 | Disposition: A | Discharge status: Home / Self Care | Payer: BC Managed Care – PPO | Source: Ambulatory Visit | Attending: Certified Nurse Midwife | Admitting: Certified Nurse Midwife

## 2023-05-24 VITALS — BP 131/83 | HR 90 | Wt 198.0 lb

## 2023-05-24 DIAGNOSIS — N898 Other specified noninflammatory disorders of vagina: Secondary | ICD-10-CM | POA: Diagnosis present

## 2023-05-24 DIAGNOSIS — Z3A38 38 weeks gestation of pregnancy: Secondary | ICD-10-CM

## 2023-05-24 DIAGNOSIS — Z3493 Encounter for supervision of normal pregnancy, unspecified, third trimester: Secondary | ICD-10-CM

## 2023-05-24 NOTE — Progress Notes (Signed)
   PRENATAL VISIT NOTE  Subjective:  Vicki Cline is a 27 y.o. G2P0010 at [redacted]w[redacted]d being seen today for ongoing prenatal care.  She is currently monitored for the following issues for this low-risk pregnancy and has Supervision of low-risk pregnancy and ASCUS of cervix with negative high risk HPV on their problem list.  Patient reports  vaginal discharge .  Contractions: Irritability. Vag. Bleeding: None.  Movement: Present. Denies leaking of fluid.   The following portions of the patient's history were reviewed and updated as appropriate: allergies, current medications, past family history, past medical history, past social history, past surgical history and problem list.   Objective:   Vitals:   05/24/23 1449  BP: 131/83  Pulse: 90  Weight: 198 lb (89.8 kg)   Fetal Status: Fetal Heart Rate (bpm): 130 Fundal Height: 38 cm Movement: Present  Presentation: Vertex  General:  Alert, oriented and cooperative. Patient is in no acute distress.  Skin: Skin is warm and dry. No rash noted.   Cardiovascular: Normal heart rate noted  Respiratory: Normal respiratory effort, no problems with respiration noted  Abdomen: Soft, gravid, appropriate for gestational age.  Pain/Pressure: Present     Pelvic: Cervical exam performed in the presence of a chaperone Dilation: 2.5 Effacement (%): 80 Station: -1  Extremities: Normal range of motion.  Edema: None  Mental Status: Normal mood and affect. Normal behavior. Normal judgment and thought content.   Assessment and Plan:  Pregnancy: G2P0010 at [redacted]w[redacted]d 1. Encounter for supervision of low-risk pregnancy in third trimester - Doing well, feeling regular and vigorous fetal movement   2. [redacted] weeks gestation of pregnancy - Routine OB care   3. Vaginal discharge - Cervicovaginal ancillary only( [place])  Term labor symptoms and general obstetric precautions including but not limited to vaginal bleeding, contractions, leaking of fluid and  fetal movement were reviewed in detail with the patient. Please refer to After Visit Summary for other counseling recommendations.   Return in about 1 week (around 05/31/2023) for IN-PERSON, LOB.  Future Appointments  Date Time Provider Department Center  05/31/2023 10:15 AM Bernerd Limbo, CNM [facility]   Bernerd Limbo, CNM

## 2023-05-25 LAB — CERVICOVAGINAL ANCILLARY ONLY
Bacterial Vaginitis (gardnerella): NEGATIVE
Candida Glabrata: NEGATIVE
Candida Vaginitis: NEGATIVE
Comment: NEGATIVE
Comment: NEGATIVE
Comment: NEGATIVE
Comment: NEGATIVE
Trichomonas: NEGATIVE

## 2023-05-31 ENCOUNTER — Ambulatory Visit (INDEPENDENT_AMBULATORY_CARE_PROVIDER_SITE_OTHER): Payer: BC Managed Care – PPO | Admitting: Certified Nurse Midwife

## 2023-05-31 ENCOUNTER — Encounter: Payer: BC Managed Care – PPO | Admitting: Certified Nurse Midwife

## 2023-05-31 ENCOUNTER — Other Ambulatory Visit: Payer: Self-pay

## 2023-05-31 ENCOUNTER — Encounter: Payer: Self-pay | Admitting: Certified Nurse Midwife

## 2023-05-31 VITALS — BP 138/77 | HR 111 | Wt 196.5 lb

## 2023-05-31 DIAGNOSIS — Z3493 Encounter for supervision of normal pregnancy, unspecified, third trimester: Secondary | ICD-10-CM

## 2023-05-31 DIAGNOSIS — Z3A39 39 weeks gestation of pregnancy: Secondary | ICD-10-CM

## 2023-05-31 NOTE — Addendum Note (Signed)
Addended by: Edd Arbour on: 05/31/2023 06:04 PM   Modules accepted: Orders

## 2023-05-31 NOTE — Progress Notes (Signed)
   PRENATAL VISIT NOTE  Subjective:  Vicki Cline is a 28 y.o. G2P0010 at [redacted]w[redacted]d being seen today for ongoing prenatal care.  She is currently monitored for the following issues for this low-risk pregnancy and has Supervision of low-risk pregnancy and ASCUS of cervix with negative high risk HPV on their problem list.  Patient reports occasional contractions.  Contractions: Irregular. Vag. Bleeding: None.  Movement: Present. Denies leaking of fluid.   The following portions of the patient's history were reviewed and updated as appropriate: allergies, current medications, past family history, past medical history, past social history, past surgical history and problem list.   Objective:   Vitals:   05/31/23 1511  BP: 138/77  Pulse: (!) 111  Weight: 196 lb 8 oz (89.1 kg)   Fetal Status: Fetal Heart Rate (bpm): 138 Fundal Height: 39 cm Movement: Present     General:  Alert, oriented and cooperative. Patient is in no acute distress.  Skin: Skin is warm and dry. No rash noted.   Cardiovascular: Normal heart rate noted  Respiratory: Normal respiratory effort, no problems with respiration noted  Abdomen: Soft, gravid, appropriate for gestational age. No pooling with speculum exam. Pain/Pressure: Present     Pelvic: Cervical exam performed in the presence of a chaperone Dilation: 3 Effacement (%): 70 Station: 0  Extremities: Normal range of motion.  Edema: Trace  Mental Status: Normal mood and affect. Normal behavior. Normal judgment and thought content.  Fern test negative.  Assessment and Plan:  Pregnancy: G2P0010 at [redacted]w[redacted]d 1. Encounter for supervision of low-risk pregnancy in third trimester - Doing well, feeling regular and vigorous fetal movement   2. [redacted] weeks gestation of pregnancy - Routine OB care  - Has been having occasional contractions with watery discharge, fern slide negative - Pt's mother is coming in on Friday night, desires IOL while Mom is in town for  support. Put on elective induction list starting Saturday with post-dates IOL scheduled for 06/13/23. Needs follow up if she is not called in by next Wednesday.  Term labor symptoms and general obstetric precautions including but not limited to vaginal bleeding, contractions, leaking of fluid and fetal movement were reviewed in detail with the patient. Please refer to After Visit Summary for other counseling recommendations.   Return in about 1 week (around 06/07/2023) for IN-PERSON, LOB.  Future Appointments  Date Time Provider Department Center  06/13/2023  6:30 AM [facility] SCHED ROOM [facility] None   Bernerd Limbo, CNM

## 2023-06-03 ENCOUNTER — Encounter (HOSPITAL_COMMUNITY): Payer: Self-pay | Admitting: Obstetrics & Gynecology

## 2023-06-03 ENCOUNTER — Other Ambulatory Visit: Payer: Self-pay

## 2023-06-03 ENCOUNTER — Inpatient Hospital Stay (HOSPITAL_COMMUNITY)
Admission: AD | Admit: 2023-06-03 | Discharge: 2023-06-06 | DRG: 807 | Disposition: A | Discharge status: Home / Self Care | Payer: BC Managed Care – PPO | Attending: Obstetrics & Gynecology | Admitting: Obstetrics & Gynecology

## 2023-06-03 DIAGNOSIS — O4202 Full-term premature rupture of membranes, onset of labor within 24 hours of rupture: Secondary | ICD-10-CM | POA: Diagnosis not present

## 2023-06-03 DIAGNOSIS — Z3490 Encounter for supervision of normal pregnancy, unspecified, unspecified trimester: Secondary | ICD-10-CM

## 2023-06-03 DIAGNOSIS — O411290 Chorioamnionitis, unspecified trimester, not applicable or unspecified: Secondary | ICD-10-CM | POA: Insufficient documentation

## 2023-06-03 DIAGNOSIS — Z7982 Long term (current) use of aspirin: Secondary | ICD-10-CM

## 2023-06-03 DIAGNOSIS — O26893 Other specified pregnancy related conditions, third trimester: Secondary | ICD-10-CM | POA: Diagnosis present

## 2023-06-03 DIAGNOSIS — Z3A39 39 weeks gestation of pregnancy: Secondary | ICD-10-CM | POA: Diagnosis not present

## 2023-06-03 DIAGNOSIS — Z3493 Encounter for supervision of normal pregnancy, unspecified, third trimester: Principal | ICD-10-CM

## 2023-06-03 DIAGNOSIS — O411230 Chorioamnionitis, third trimester, not applicable or unspecified: Secondary | ICD-10-CM | POA: Diagnosis not present

## 2023-06-03 LAB — CBC
HCT: 36.6 % (ref 36.0–46.0)
Hemoglobin: 11.9 g/dL — ABNORMAL LOW (ref 12.0–15.0)
MCH: 29.2 pg (ref 26.0–34.0)
MCHC: 32.5 g/dL (ref 30.0–36.0)
MCV: 89.9 fL (ref 80.0–100.0)
Platelets: 164 10*3/uL (ref 150–400)
RBC: 4.07 MIL/uL (ref 3.87–5.11)
RDW: 13.7 % (ref 11.5–15.5)
WBC: 7.1 10*3/uL (ref 4.0–10.5)
nRBC: 0 % (ref 0.0–0.2)

## 2023-06-03 LAB — TYPE AND SCREEN
ABO/RH(D): O POS
Antibody Screen: NEGATIVE

## 2023-06-03 LAB — AMNISURE RUPTURE OF MEMBRANE (ROM) NOT AT ARMC: Amnisure ROM: POSITIVE

## 2023-06-03 LAB — RPR: RPR Ser Ql: NONREACTIVE

## 2023-06-03 MED ORDER — DIPHENHYDRAMINE HCL 50 MG/ML IJ SOLN: 12.5000 mg | INTRAMUSCULAR | Status: DC | PRN

## 2023-06-03 MED ORDER — LACTATED RINGERS IV SOLN: INTRAVENOUS | Status: DC

## 2023-06-03 MED ORDER — LACTATED RINGERS IV SOLN
500.0000 mL | Freq: Once | INTRAVENOUS | Status: AC
  Administered 2023-06-03: 500 mL via INTRAVENOUS

## 2023-06-03 MED ORDER — MISOPROSTOL 50MCG HALF TABLET: 50.0000 ug | ORAL_TABLET | ORAL | Status: DC | PRN

## 2023-06-03 MED ORDER — ONDANSETRON HCL 4 MG/2ML IJ SOLN: 4.0000 mg | Freq: Four times a day (QID) | INTRAMUSCULAR | Status: DC | PRN

## 2023-06-03 MED ORDER — FLEET ENEMA 7-19 GM/118ML RE ENEM: 1.0000 | ENEMA | RECTAL | Status: DC | PRN

## 2023-06-03 MED ORDER — OXYTOCIN-SODIUM CHLORIDE 30-0.9 UT/500ML-% IV SOLN
1.0000 m[IU]/min | INTRAVENOUS | Status: DC
  Administered 2023-06-03: 2 m[IU]/min via INTRAVENOUS
  Filled 2023-06-03: qty 500

## 2023-06-03 MED ORDER — FENTANYL CITRATE (PF) 100 MCG/2ML IJ SOLN
INTRAMUSCULAR | Status: AC
  Administered 2023-06-03: 100 ug
  Filled 2023-06-03: qty 2

## 2023-06-03 MED ORDER — FENTANYL-BUPIVACAINE-NACL 0.5-0.125-0.9 MG/250ML-% EP SOLN
12.0000 mL/h | EPIDURAL | Status: DC | PRN
  Administered 2023-06-04: 12 mL/h via EPIDURAL
  Filled 2023-06-03: qty 250

## 2023-06-03 MED ORDER — OXYTOCIN-SODIUM CHLORIDE 30-0.9 UT/500ML-% IV SOLN: 2.5000 [IU]/h | INTRAVENOUS | Status: DC

## 2023-06-03 MED ORDER — FENTANYL CITRATE (PF) 100 MCG/2ML IJ SOLN: 100.0000 ug | INTRAMUSCULAR | Status: DC | PRN

## 2023-06-03 MED ORDER — OXYTOCIN BOLUS FROM INFUSION
333.0000 mL | Freq: Once | INTRAVENOUS | Status: AC
  Administered 2023-06-04: 333 mL via INTRAVENOUS

## 2023-06-03 MED ORDER — SODIUM CHLORIDE 0.9 % IV SOLN: 250.0000 mL | INTRAVENOUS | Status: DC | PRN

## 2023-06-03 MED ORDER — OXYCODONE-ACETAMINOPHEN 5-325 MG PO TABS: 1.0000 | ORAL_TABLET | ORAL | Status: DC | PRN

## 2023-06-03 MED ORDER — SOD CITRATE-CITRIC ACID 500-334 MG/5ML PO SOLN: 30.0000 mL | ORAL | Status: DC | PRN

## 2023-06-03 MED ORDER — SODIUM CHLORIDE 0.9% FLUSH
3.0000 mL | Freq: Two times a day (BID) | INTRAVENOUS | Status: DC
  Administered 2023-06-03: 3 mL via INTRAVENOUS

## 2023-06-03 MED ORDER — LACTATED RINGERS IV SOLN
500.0000 mL | INTRAVENOUS | Status: DC | PRN
  Administered 2023-06-03: 500 mL via INTRAVENOUS

## 2023-06-03 MED ORDER — LIDOCAINE HCL (PF) 1 % IJ SOLN: 30.0000 mL | INTRAMUSCULAR | Status: DC | PRN

## 2023-06-03 MED ORDER — TERBUTALINE SULFATE 1 MG/ML IJ SOLN: 0.2500 mg | Freq: Once | INTRAMUSCULAR | Status: DC | PRN

## 2023-06-03 MED ORDER — ACETAMINOPHEN 325 MG PO TABS: 650.0000 mg | ORAL_TABLET | ORAL | Status: DC | PRN

## 2023-06-03 MED ORDER — EPHEDRINE 5 MG/ML INJ: 10.0000 mg | INTRAVENOUS | Status: DC | PRN

## 2023-06-03 MED ORDER — OXYCODONE-ACETAMINOPHEN 5-325 MG PO TABS: 2.0000 | ORAL_TABLET | ORAL | Status: DC | PRN

## 2023-06-03 MED ORDER — PHENYLEPHRINE 80 MCG/ML (10ML) SYRINGE FOR IV PUSH (FOR BLOOD PRESSURE SUPPORT): 80.0000 ug | PREFILLED_SYRINGE | INTRAVENOUS | Status: DC | PRN

## 2023-06-03 MED ORDER — SODIUM CHLORIDE 0.9% FLUSH: 3.0000 mL | INTRAVENOUS | Status: DC | PRN

## 2023-06-03 NOTE — Progress Notes (Signed)
Vicki Cline is a 27 y.o. G2P0010 at [redacted]w[redacted]d  Subjective: Resting in bed for NST. Support person at bedside. Pain score unchanged but contractions slightly more frequent.  Objective: BP 123/70   Pulse 90   Temp 98.7 F (37.1 C) (Oral)   Resp 17   Ht 5\' 5"  (1.651 m)   Wt 90.1 kg   LMP 08/31/2022   SpO2 100%   BMI 33.07 kg/m  No intake/output data recorded. No intake/output data recorded.  FHT:  Baseline 135, mod var, + accels, no decels UC:   q 3-5 min SVE:   Dilation: 4.5 Effacement (%): 80 Station: -1 Exam by:: Troy Sine, RN  Labs: Lab Results  Component Value Date   WBC 7.1 06/03/2023   HGB 11.9 (L) 06/03/2023   HCT 36.6 06/03/2023   MCV 89.9 06/03/2023   PLT 164 06/03/2023    Assessment / Plan: --27 y.o. G2P0010 admitted for SROM at 0430  --For waterbirth. Consent signed.   --Patient's stickers do not match her legal name (missing "h" at end of first name). MAU registration notified, states to CNM that patient's name is too long. CNM asked registration to contact their boss to confirm. [employer] civil engineer and [employer] notified  Labor:  --Cervix 3.5/80/-2 on arrival to MAU,  now 4.5/80/-1 (about 6 hours between exams)   --Discussed functional prime status, likelihood of some cervical change 2/2 SROM  --Recommended augmentation with low dose Pitocin. Declined by patient.  --Will plan to recheck cervix in 6 hours unless otherwise indicated by change in status  Fetal Wellbeing:  Category I  Pain Control:  Labor support without medications  I/D:   GBS neg  Anticipated MOD:  NSVD  Calvert Cantor, CNM 06/03/2023, 12:20 PM

## 2023-06-03 NOTE — Anesthesia Preprocedure Evaluation (Signed)
Anesthesia Evaluation  Patient identified by MRN, date of birth, ID band Patient awake    Reviewed: Allergy & Precautions, NPO status , Patient's Chart, lab work & pertinent test results  History of Anesthesia Complications Negative for: history of anesthetic complications  Airway Mallampati: III  TM Distance: >3 FB Neck ROM: Full    Dental   Pulmonary neg pulmonary ROS   Pulmonary exam normal breath sounds clear to auscultation       Cardiovascular negative cardio ROS  Rhythm:Regular Rate:Normal     Neuro/Psych negative neurological ROS     GI/Hepatic negative GI ROS, Neg liver ROS,,,  Endo/Other  negative endocrine ROS    Renal/GU negative Renal ROS     Musculoskeletal   Abdominal   Peds  Hematology negative hematology ROS (+)   Anesthesia Other Findings Takes baby aspirin  Reproductive/Obstetrics (+) Pregnancy                              Anesthesia Physical Anesthesia Plan  ASA: 2  Anesthesia Plan: Epidural   Post-op Pain Management:    Induction:   PONV Risk Score and Plan:   Airway Management Planned:   Additional Equipment:   Intra-op Plan:   Post-operative Plan:   Informed Consent: I have reviewed the patients History and Physical, chart, labs and discussed the procedure including the risks, benefits and alternatives for the proposed anesthesia with the patient or authorized representative who has indicated his/her understanding and acceptance.       Plan Discussed with: Anesthesiologist  Anesthesia Plan Comments: (I have discussed risks of neuraxial anesthesia including but not limited to infection, bleeding, nerve injury, back pain, headache, seizures, and failure of block. Patient denies bleeding disorders and is not currently anticoagulated. Labs have been reviewed. Risks and benefits discussed. All patient's questions answered.  )          Anesthesia Quick Evaluation  

## 2023-06-03 NOTE — MAU Provider Note (Addendum)
S: Vicki Cline is a 27 y.o. G2P0010 at [redacted]w[redacted]d  who presents to MAU today complaining of leaking of fluid since 0400. She reports after using the BR at 0400. She sat on the bed and there was a puddle of fluid on the bed. She wiped and had blood in it. She went back to the BR and wiped again and there was more fluid with blood in it. She endorses vaginal bleeding. She endorses contractions since before the leaking started. She reports normal fetal movement.    O: BP 130/88   Pulse 99   Temp 98 F (36.7 C) (Oral)   Resp 16   Ht 5\' 5"  (1.651 m)   Wt 90.1 kg   LMP 08/31/2022   SpO2 99%   BMI 33.07 kg/m  GENERAL: Well-developed, well-nourished female in no acute distress.  HEAD: Normocephalic, atraumatic.  CHEST: Normal effort of breathing, regular heart rate ABDOMEN: Soft, nontender, gravid PELVIC: Normal external female genitalia. Vagina is pink and rugated. Cervix with normal contour, no lesions. (+) pooling of thick, mucoid fluid c/w mucous plug.   Cervical exam:  Dilation: 3.5 Effacement (%): 80 Station: -2 Presentation: Vertex Exam by:: Felipa Furnace RN  Fetal Monitoring: Baseline: 130 Variability: moderate Accelerations: 15 x 15 present Decelerations: absent Contractions: irregular UC's every 4-6 mins with UI noted   Results for orders placed or performed during the hospital encounter of 06/03/23 (from the past 24 hour(s))  Amnisure rupture of membrane (rom)not at [facility]     Status: None   Collection Time: 06/03/23  8:03 AM  Result Value Ref Range   Amnisure ROM POSITIVE   Type and screen [facility]     Status: None (Preliminary result)   Collection Time: 06/03/23  9:14 AM  Result Value Ref Range   ABO/RH(D) PENDING    Antibody Screen PENDING    Sample Expiration      06/06/2023,2359 Performed at [facility], [address] [place], [place] [address]   CBC     Status: Abnormal   Collection Time: 06/03/23  9:15 AM  Result  Value Ref Range   WBC 7.1 4.0 - 10.5 K/uL   RBC 4.07 3.87 - 5.11 MIL/uL   Hemoglobin 11.9 (L) 12.0 - 15.0 g/dL   HCT 19.1 47.8 - 29.5 %   MCV 89.9 80.0 - 100.0 fL   MCH 29.2 26.0 - 34.0 pg   MCHC 32.5 30.0 - 36.0 g/dL   RDW 62.1 30.8 - 65.7 %   Platelets 164 150 - 400 K/uL   nRBC 0.0 0.0 - 0.2 %     A: SIUP at [redacted]w[redacted]d   Report given to Venia Carbon, FNP @ [address], CNM 06/03/2023, 6:51 AM  P:  Amnisure positive Admit to labor Thalia Bloodgood CNM at bedside.  Venia Carbon I, NP 06/03/2023 9:59 AM

## 2023-06-03 NOTE — MAU Note (Signed)
Pt says SROM- at 0430- fluid ran down leg  The puddle of fluid had blood in it- and when wiping.  PNC- clinic VE 3 cm on Wed Denies HSV GBS-neg  UC's - some  irreg

## 2023-06-03 NOTE — Progress Notes (Signed)
Vicki Cline is a 27 y.o. G2P0010 at [redacted]w[redacted]d   Subjective: Ongoing intensification of contractions. Patient's mom, Vicki Cline and partner Vicki Cline at bedside.  Objective: BP 132/72   Pulse 93   Temp 98.1 F (36.7 C) (Oral)   Resp 16   Ht 5\' 5"  (1.651 m)   Wt 90.1 kg   LMP 08/31/2022   SpO2 100%   BMI 33.07 kg/m  No intake/output data recorded. Total I/O In: 3 [I.V.:3] Out: -   FHT:  Baseline 135, mod var, + accels, no decels UC:   q 2-4 min SVE:   Dilation: 4.5 Effacement (%): 80 Station: -1 Exam by:: Troy Sine, RN  Labs: Lab Results  Component Value Date   WBC 7.1 06/03/2023   HGB 11.9 (L) 06/03/2023   HCT 36.6 06/03/2023   MCV 89.9 06/03/2023   PLT 164 06/03/2023    Assessment / Plan: --27 y.o. G2P0010 at [redacted]w[redacted]d s/p SROM at 0430 today --Brief period of variables, now Cat I --Can d/c continuous monitoring --Plan to recheck cervix around 6pm --Anticipate SVD, remains eligible for waterbirth  Calvert Cantor, CNM 06/03/2023, 3:21 PM

## 2023-06-03 NOTE — H&P (Signed)
LABOR AND DELIVERY ADMISSION HISTORY AND PHYSICAL NOTE  Vicki Cline is a 27 y.o. female G2P0010 with IUP at [redacted]w[redacted]d by 7 week [place] presenting for SROM at 0430 today.  She endorses moderately painful contractions q 8-10 minutes with pain score of 5/10. She reports positive fetal movement and mild bloody show. She plans on breast and bottle feeding. Her contraception plan is: Depo, to be initiated at postpartum visit.  Prenatal History/Complications: PNC at [facility]:  @[redacted]w[redacted]d , CWD, normal anatomy, breech presentation, posterior placenta, 47%ile, EFW 270 grams  Pregnancy complications:  - Abnormal pap smear (ASCUS with negative HPV)  Past Medical History: Past Medical History:  Diagnosis Date   Medical history non-contributory     Past Surgical History: Past Surgical History:  Procedure Laterality Date   NO PAST SURGERIES      Obstetrical History: OB History     Gravida  2   Para  0   Term  0   Preterm  0   AB  1   Living  0      SAB  0   IAB  1   Ectopic  0   Multiple  0   Live Births  0           Social History: Social History   Socioeconomic History   Marital status: Significant Other    Spouse name: Not on file   Number of children: Not on file   Years of education: Not on file   Highest education level: Not on file  Occupational History   Not on file  Tobacco Use   Smoking status: Never   Smokeless tobacco: Never  Vaping Use   Vaping Use: Never used  Substance and Sexual Activity   Alcohol use: Not Currently    Comment: occasional   Drug use: Never   Sexual activity: Yes    Partners: Male  Other Topics Concern   Not on file  Social History Narrative   Not on file   Social Determinants of Health   Financial Resource Strain: Not on file  Food Insecurity: No Food Insecurity (11/05/2020)   Hunger Vital Sign    Worried About Running Out of Food in the Last Year: Never true    Ran Out of Food in the Last Year: Never true   Transportation Needs: No Transportation Needs (11/05/2020)   PRAPARE - Administrator, Civil Service (Medical): No    Lack of Transportation (Non-Medical): No  Physical Activity: Not on file  Stress: Not on file  Social Connections: Not on file    Family History: Family History  Problem Relation Age of Onset   Hypertension Maternal Grandmother     Allergies: No Known Allergies  Medications Prior to Admission  Medication Sig Dispense Refill Last Dose   ASPIRIN 81 PO Take by mouth.   06/02/2023   Prenatal Vit-Fe Fumarate-FA (MULTIVITAMIN-PRENATAL) 27-0.8 MG TABS tablet Take 1 tablet by mouth daily at 12 noon.   06/02/2023     Review of Systems  All systems reviewed and negative except as stated in HPI  Physical Exam Blood pressure 130/88, pulse 99, temperature 98 F (36.7 C), temperature source Oral, resp. rate 16, height 5\' 5"  (1.651 m), weight 90.1 kg, last menstrual period 08/31/2022, SpO2 99 %. General appearance: alert, oriented Lungs: normal respiratory effort Heart: regular rate Abdomen: soft, non-tender; gravid Extremities: No calf swelling or tenderness Presentation: cephalic by suture per RN exam in MAU  Fetal monitoring: Baseline  140, mod var, + 15 x 15 accels, no decels Uterine activity: q 4-8 min  Dilation: 3.5 Effacement (%): 80 Station: -2 Exam by:: Felipa Furnace RN  Prenatal labs: ABO, Rh: O/Positive/-- (12/07 1118) Antibody: Negative (12/07 1118) Rubella: 3.71 (12/07 1118) RPR: Non Reactive (03/18 0903)  HBsAg: Negative (12/07 1118)  HIV: Non Reactive (03/18 0903)  GC/Chlamydia: Neg  GBS: Negative/-- (05/28 0346)  2-hr GTT: WNL Genetic screening:  Low risk female Anatomy US: WNL  Prenatal Transfer Tool  Maternal Diabetes: No Genetic Screening: Normal Maternal Ultrasounds/Referrals: Normal Fetal Ultrasounds or other Referrals:  None Maternal Substance Abuse:  No Significant Maternal Medications:  None Significant Maternal Lab  Results: Group B Strep negative  Results for orders placed or performed during the hospital encounter of 06/03/23 (from the past 24 hour(s))  Amnisure rupture of membrane (rom)not at [facility]   Collection Time: 06/03/23  8:03 AM  Result Value Ref Range   Amnisure ROM POSITIVE     Patient Active Problem List   Diagnosis Date Noted   ASCUS of cervix with negative high risk HPV 01/17/2023   Supervision of low-risk pregnancy 11/09/2022    Assessment: Vicki Cline is a 27 y.o. G2P0010 at [redacted]w[redacted]d here for SROM at 0430  #Labor: Desires waterbirth, plan to recheck cervix in 4-6 hours to confirm labor, will consent for waterbirth at that time.  #Pain: Desires waterbirth #FWB: Cat I #GBS/ID: Neg #MOF: Both #MOC: Depo at postpartum visit #Circ: No  Calvert Cantor, MSA, MSN, CNM 06/03/2023, 9:26 AM

## 2023-06-03 NOTE — Progress Notes (Signed)
Vicki Cline is a 27 y.o. G2P0010 at [redacted]w[redacted]d admitted for SROM at 0430 today.  Subjective: Patient resting in bed. Partner Vicki Cline at bedside and engaged in care. Patient recently reports to [facility] she would like to nap.   Objective: BP 117/77   Pulse 91   Temp 98.4 F (36.9 C) (Oral)   Resp 16   Ht 5\' 5"  (1.651 m)   Wt 90.1 kg   LMP 08/31/2022   SpO2 100%   BMI 33.07 kg/m  No intake/output data recorded. Total I/O In: 1621.3 [P.O.:480; I.V.:1141.3] Out: -   FHT:  Reactive NST at 1715 UC:   q 3-6 min SVE:   Dilation: 4.5 Effacement (%): 80 Station: -1 Exam by:: Troy Sine, RN  Labs: Lab Results  Component Value Date   WBC 7.1 06/03/2023   HGB 11.9 (L) 06/03/2023   HCT 36.6 06/03/2023   MCV 89.9 06/03/2023   PLT 164 06/03/2023    Assessment / Plan: --27 y.o. G2P0010 at [redacted]w[redacted]d  --S/p SROM 0430 ~ 14 hours ago, afebrile, clear fluid, neither maternal nor fetal tachycardia --Reassuring fetal surveillance via intermittent monitoring --Patient due for exam, currently wanting to nap. No intervention needed if she has progressed to active labor. Preemptive discussion of Pitocin augmentation if little cervical change. Reviewed safety precautions, ability to reduce or turn off Pitocin uterine tachysytole/Cat II tracing --Patient to discuss with her partner and notify us of her decision --Anticipate vaginal birth, continues to desire waterbirth  Calvert Cantor, CNM 06/03/2023, 6:25 PM

## 2023-06-04 ENCOUNTER — Inpatient Hospital Stay (HOSPITAL_COMMUNITY): Payer: BC Managed Care – PPO | Admitting: Anesthesiology

## 2023-06-04 ENCOUNTER — Encounter (HOSPITAL_COMMUNITY): Payer: Self-pay | Admitting: Family Medicine

## 2023-06-04 DIAGNOSIS — Z3A39 39 weeks gestation of pregnancy: Secondary | ICD-10-CM

## 2023-06-04 DIAGNOSIS — O411290 Chorioamnionitis, unspecified trimester, not applicable or unspecified: Secondary | ICD-10-CM | POA: Insufficient documentation

## 2023-06-04 DIAGNOSIS — O4202 Full-term premature rupture of membranes, onset of labor within 24 hours of rupture: Secondary | ICD-10-CM

## 2023-06-04 DIAGNOSIS — O411230 Chorioamnionitis, third trimester, not applicable or unspecified: Secondary | ICD-10-CM

## 2023-06-04 MED ORDER — SODIUM CHLORIDE 0.9% FLUSH
3.0000 mL | Freq: Two times a day (BID) | INTRAVENOUS | Status: DC
  Administered 2023-06-04: 3 mL via INTRAVENOUS

## 2023-06-04 MED ORDER — ONDANSETRON HCL 4 MG PO TABS: 4.0000 mg | ORAL_TABLET | ORAL | Status: DC | PRN

## 2023-06-04 MED ORDER — SODIUM CHLORIDE 0.9% FLUSH: 3.0000 mL | INTRAVENOUS | Status: DC | PRN

## 2023-06-04 MED ORDER — SIMETHICONE 80 MG PO CHEW: 80.0000 mg | CHEWABLE_TABLET | ORAL | Status: DC | PRN

## 2023-06-04 MED ORDER — SODIUM CHLORIDE 0.9 % IV SOLN: INTRAVENOUS | Status: DC | PRN

## 2023-06-04 MED ORDER — WITCH HAZEL-GLYCERIN EX PADS: 1.0000 | MEDICATED_PAD | CUTANEOUS | Status: DC | PRN

## 2023-06-04 MED ORDER — ONDANSETRON HCL 4 MG/2ML IJ SOLN: 4.0000 mg | INTRAMUSCULAR | Status: DC | PRN

## 2023-06-04 MED ORDER — ACETAMINOPHEN 325 MG PO TABS
650.0000 mg | ORAL_TABLET | ORAL | Status: DC | PRN
  Administered 2023-06-04: 650 mg via ORAL
  Filled 2023-06-04: qty 2

## 2023-06-04 MED ORDER — BENZOCAINE-MENTHOL 20-0.5 % EX AERO
1.0000 | INHALATION_SPRAY | CUTANEOUS | Status: DC | PRN
  Filled 2023-06-04: qty 56

## 2023-06-04 MED ORDER — GENTAMICIN SULFATE 40 MG/ML IJ SOLN
5.0000 mg/kg | INTRAVENOUS | Status: DC
  Administered 2023-06-04: 350 mg via INTRAVENOUS
  Filled 2023-06-04: qty 8.75

## 2023-06-04 MED ORDER — SODIUM CHLORIDE 0.9 % IV SOLN
2.0000 g | Freq: Four times a day (QID) | INTRAVENOUS | Status: DC
  Administered 2023-06-04: 2 g via INTRAVENOUS

## 2023-06-04 MED ORDER — PRENATAL MULTIVITAMIN CH
1.0000 | ORAL_TABLET | Freq: Every day | ORAL | Status: DC
  Administered 2023-06-04 – 2023-06-06 (×3): 1 via ORAL
  Filled 2023-06-04 (×3): qty 1

## 2023-06-04 MED ORDER — SODIUM CHLORIDE 0.9 % IV SOLN
INTRAVENOUS | Status: AC
  Filled 2023-06-04: qty 2000

## 2023-06-04 MED ORDER — DIBUCAINE (PERIANAL) 1 % EX OINT: 1.0000 | TOPICAL_OINTMENT | CUTANEOUS | Status: DC | PRN

## 2023-06-04 MED ORDER — DIPHENHYDRAMINE HCL 25 MG PO CAPS: 25.0000 mg | ORAL_CAPSULE | Freq: Four times a day (QID) | ORAL | Status: DC | PRN

## 2023-06-04 MED ORDER — SENNOSIDES-DOCUSATE SODIUM 8.6-50 MG PO TABS
2.0000 | ORAL_TABLET | ORAL | Status: DC
  Administered 2023-06-04 – 2023-06-06 (×3): 2 via ORAL
  Filled 2023-06-04 (×3): qty 2

## 2023-06-04 MED ORDER — OXYCODONE HCL 5 MG PO TABS: 5.0000 mg | ORAL_TABLET | ORAL | Status: DC | PRN

## 2023-06-04 MED ORDER — ZOLPIDEM TARTRATE 5 MG PO TABS: 5.0000 mg | ORAL_TABLET | Freq: Every evening | ORAL | Status: DC | PRN

## 2023-06-04 MED ORDER — COCONUT OIL OIL: 1.0000 | TOPICAL_OIL | Status: DC | PRN

## 2023-06-04 MED ORDER — TETANUS-DIPHTH-ACELL PERTUSSIS 5-2.5-18.5 LF-MCG/0.5 IM SUSY: 0.5000 mL | PREFILLED_SYRINGE | Freq: Once | INTRAMUSCULAR | Status: DC

## 2023-06-04 MED ORDER — LIDOCAINE HCL (PF) 1 % IJ SOLN
INTRAMUSCULAR | Status: DC | PRN
  Administered 2023-06-04: 5 mL via EPIDURAL
  Administered 2023-06-04: 3 mL via EPIDURAL

## 2023-06-04 MED ORDER — ACETAMINOPHEN 500 MG PO TABS
1000.0000 mg | ORAL_TABLET | Freq: Once | ORAL | Status: AC
  Administered 2023-06-04: 1000 mg via ORAL
  Filled 2023-06-04: qty 2

## 2023-06-04 MED ORDER — IBUPROFEN 600 MG PO TABS
600.0000 mg | ORAL_TABLET | Freq: Four times a day (QID) | ORAL | Status: DC
  Administered 2023-06-04 – 2023-06-06 (×9): 600 mg via ORAL
  Filled 2023-06-04 (×9): qty 1

## 2023-06-04 NOTE — Plan of Care (Signed)

## 2023-06-04 NOTE — Progress Notes (Signed)
Labor Progress Note Vicki Cline is a 28 y.o. G2P0010 at [redacted]w[redacted]d presented for SROM S: doing well. Just got an epidural and is a lot more comfortable  O:  BP 132/87   Pulse 94   Temp 99.4 F (37.4 C) (Axillary)   Resp 16   Ht 5\' 5"  (1.651 m)   Wt 90.1 kg   LMP 08/31/2022   SpO2 99%   BMI 33.07 kg/m  EFM: 140bpm /moderate/+accels, intermittent variable decels  CVE: Dilation: 5 Effacement (%): 90 Cervical Position: Anterior Station: -2 Presentation: Vertex Exam by:: Dr. Ladon Applebaum   A&P: 27 y.o. G2P0010 [redacted]w[redacted]d who presented for SROM at 0430 #Labor: Progressing well. On pitocin at 2 units, forebag AROMed with clear fluid #Pain: epidural in placed #FWB: cat 2 due to intermittent variable and late decels. FHR overall reassuring, given spontaneous accels and moderate variability #GBS negative   [name] Lizabeth Leyden, MD 12:40 AM

## 2023-06-04 NOTE — Anesthesia Procedure Notes (Signed)
Epidural Patient location during procedure: OB Start time: 06/04/2023 12:03 AM End time: 06/04/2023 12:08 AM  Staffing Anesthesiologist: Linton Rump, MD Performed: anesthesiologist   Preanesthetic Checklist Completed: patient identified, IV checked, site marked, risks and benefits discussed, surgical consent, monitors and equipment checked, pre-op evaluation and timeout performed  Epidural Patient position: sitting Prep: DuraPrep and site prepped and draped Patient monitoring: continuous pulse ox and blood pressure Approach: midline Location: L3-L4 Injection technique: LOR saline  Needle:  Needle type: Tuohy  Needle gauge: 17 G Needle length: 9 cm and 9 Needle insertion depth: 4 cm Catheter type: closed end flexible Catheter size: 19 Gauge Catheter at skin depth: 8 cm Test dose: negative  Assessment Events: blood not aspirated, no cerebrospinal fluid, injection not painful, no injection resistance, no paresthesia and negative IV test  Additional Notes The patient has requested an epidural for labor pain management. Risks and benefits including, but not limited to, infection, bleeding, local anesthetic toxicity, headache, hypotension, back pain, block failure, etc. were discussed with the patient. The patient expressed understanding and consented to the procedure. I confirmed that the patient has no bleeding disorders and is not taking blood thinners. I confirmed the patient's last platelet count with the nurse. A time-out was performed immediately prior to the procedure. Please see nursing documentation for vital signs. Sterile technique was used throughout the whole procedure. Once LOR achieved, the epidural catheter threaded easily without resistance. Aspiration of the catheter was negative for blood and CSF. The epidural was dosed slowly and an infusion was started.  1 attempt(s)Reason for block:procedure for pain

## 2023-06-04 NOTE — Discharge Summary (Signed)
Postpartum Discharge Summary  Date of Service updated***     Patient Name: Vicki Cline DOB: 21-Jul-1996 MRN: [id number]  Date of admission: 06/03/2023 Delivery date:06/04/2023  Delivering provider: Alfredia Ferguson  Date of discharge: 06/04/2023  Admitting diagnosis: Indication for care in labor and delivery, antepartum [O75.9] Indication for care in labor or delivery [O75.9] Intrauterine pregnancy: [redacted]w[redacted]d     Secondary diagnosis:  Principal Problem:   SVD (spontaneous vaginal delivery) Active Problems:   Supervision of low-risk pregnancy   Chorioamnionitis  Additional problems: ***    Discharge diagnosis: {DX.:23714}                                              Post partum procedures:{Postpartum procedures:23558} Augmentation: AROM and Pitocin Complications: Intrauterine Inflammation or infection (Chorioamniotis)  Hospital course: Onset of Labor With Vaginal Delivery      27 y.o. yo G2P0010 at [redacted]w[redacted]d was admitted in Latent Labor following SROM on 06/03/2023. Labor course was complicated by intrapartum intra-amniotic infection, just prior to 2nd stage of labor and she received one dose of ampicillin and gentamicin.  Membrane Rupture Time/Date: 4:30 AM ,06/03/2023   Delivery Method:Vaginal, Spontaneous  Episiotomy: None  Lacerations:  None  Patient had a postpartum course complicated by ***.  She is ambulating, tolerating a regular diet, passing flatus, and urinating well. Patient is discharged home in stable condition on 06/04/23.  Newborn Data: Birth date:06/04/2023  Birth time:7:15 AM  Gender:Female  Living status:Living  Apgars:7 ,9  Weight:   Magnesium Sulfate received: {Mag received:30440022} BMZ received: {BMZ received:30440023} Rhophylac:{Rhophylac received:30440032} WUJ:{WJX:91478295} T-DaP:{Tdap:23962} Flu: {AOZ:30865} Transfusion:{Transfusion received:30440034}  Physical exam  Vitals:   06/04/23 0530 06/04/23 0600 06/04/23 0730 06/04/23  0745  BP: (!) 117/93 125/62 112/63 126/77  Pulse: (!) 111 (!) 120 (!) 122 (!) 106  Resp:   16   Temp: (!) 101.6 F (38.7 C)  99.7 F (37.6 C)   TempSrc: Axillary  Oral   SpO2:      Weight:      Height:       General: {Exam; general:21111117} Lochia: {Desc; appropriate/inappropriate:30686::"appropriate"} Uterine Fundus: {Desc; firm/soft:30687} Incision: {Exam; incision:21111123} DVT Evaluation: {Exam; dvt:2111122} Labs: Lab Results  Component Value Date   WBC 7.1 06/03/2023   HGB 11.9 (L) 06/03/2023   HCT 36.6 06/03/2023   MCV 89.9 06/03/2023   PLT 164 06/03/2023      Latest Ref Rng & Units 09/17/2020   11:49 AM  CMP  Glucose 70 - 99 mg/dL 84   BUN 6 - 20 mg/dL 9   Creatinine 7.84 - 6.96 mg/dL 2.95   Sodium 284 - 132 mmol/L 138   Potassium 3.5 - 5.1 mmol/L 3.7   Chloride 98 - 111 mmol/L 103   CO2 22 - 32 mmol/L 26   Calcium 8.9 - 10.3 mg/dL 9.3   Total Protein 6.5 - 8.1 g/dL 7.4   Total Bilirubin 0.3 - 1.2 mg/dL 0.6   Alkaline Phos 38 - 126 U/L 60   AST 15 - 41 U/L 17   ALT 0 - 44 U/L 11    Edinburgh Score:     No data to display           After visit meds:  Allergies as of 06/04/2023   No Known Allergies   Med Rec must be completed prior to using this [facility]***  Discharge home in stable condition Infant Feeding: {Baby feeding:23562} Infant Disposition:{CHL IP OB HOME WITH MVHQIO:96295} Discharge instruction: per After Visit Summary and Postpartum booklet. Activity: Advance as tolerated. Pelvic rest for 6 weeks.  Diet: {OB MWUX:32440102} Future Appointments: Future Appointments  Date Time Provider Department Center  06/07/2023  3:35 PM Bernerd Limbo, CNM [facility]   Follow up Visit:  The following message was sent to [facility]  Please schedule this patient for a In person postpartum visit in 6 weeks with the following provider: Any provider. Additional Postpartum F/U: none   Low risk pregnancy complicated by:  none Delivery mode:   Vaginal, Spontaneous  Anticipated Birth Control:  Depo   06/04/2023 Brittyn Salaz Lizabeth Leyden, MD

## 2023-06-04 NOTE — Progress Notes (Signed)
Pharmacy Antibiotic Note  Vicki Cline is a 27 y.o. female admitted on 06/03/2023 with SROM and labor, now with maternal temp and presumed chorioamnionitis.  Pharmacy has been consulted for gentamicin dosing.  Plan: Gentamicin 5 mg/kg adjusted body weight Q24 hours  Height: 5\' 5"  (165.1 cm) Weight: 90.1 kg (198 lb 11.2 oz) IBW/kg (Calculated) : 57  Temp (24hrs), Avg:98.9 F (37.2 C), Min:98 F (36.7 C), Max:101.6 F (38.7 C)  Recent Labs  Lab 06/03/23 0915  WBC 7.1    CrCl cannot be calculated (Patient's most recent lab result is older than the maximum 21 days allowed.).    No Known Allergies  Antimicrobials this admission: Ampicillin 2g Q6 6/16 >>  Gentamicin 6/16  >>   Thank you for allowing pharmacy to be a part of this patient's care.  Loyola Mast 06/04/2023 5:52 AM

## 2023-06-05 NOTE — Lactation Note (Signed)
This note was copied from a baby's chart. Lactation Consultation Note  Patient Name: Vicki Cline ATFTD'D Date: 06/05/2023 Age:26 hours  Attempted to see mom. Mom sleeping. FOB giving baby bottle. Mom had DEBP at bedside. Waved at dad and he nodded his head. LC will try later.   Maternal Data    Feeding    LATCH Score                    Lactation Tools Discussed/Used    Interventions    Discharge    Consult Status      [name] 06/05/2023, 12:04 AM

## 2023-06-05 NOTE — Lactation Note (Addendum)
This note was copied from a baby's chart. Lactation Consultation Note  Patient Name: Vicki Cline ZOXWR'U Date: 06/05/2023 Age:26 hours Reason for consult: Initial assessment;1st time breastfeeding  P1, Reviewed hand expression with drops expressed.  Baby attempted breastfeeding x 2 after birth and has been supplemented with 8-12 ml of formula q 3 hours.  Baby is sleepy.  Attempted latching and gave 2 ml of formula in syringe to interest in feeding and baby did not wake.  Placed baby skin to skin on mother's chest.  Suggest calling when baby cues.  If baby does not wake to feed in one hour, family will supplement with formula and mother will pump using DEBP.   Maternal Data Has patient been taught Hand Expression?: Yes Does the patient have breastfeeding experience prior to this delivery?: No  Feeding Mother's Current Feeding Choice: Breast Milk and Formula   Lactation Tools Discussed/Used Tools: Pump Flange Size: 24 Breast pump type: Double-Electric Breast Pump Pumping frequency: q 3 hours  Interventions Interventions: Breast feeding basics reviewed;Assisted with latch;Skin to skin;Hand express;Adjust position;Support pillows;Education;LC Services brochure  Discharge Pump: Personal;DEBP;Manual  Consult Status Consult Status: Follow-up Date: 06/06/23 Follow-up type: In-patient    Vicki Cline 06/05/2023, 9:36 AM

## 2023-06-05 NOTE — Lactation Note (Signed)
This note was copied from a baby's chart. Lactation Consultation Note  Patient Name: Vicki Cline ZOXWR'U Date: 06/05/2023 Age:26 hours  LC saw rm. Door open. LC went in. Mom awake taking medication. FOB awake sitting on cough giving baby formula. Introduced self. Told mom I had attempted to see her a couple of times and she was sleeping. Mom stated she has been really tired and needed it. Asked mom when she is ready to latch to call for Lactation to assist in latching. Mom stated ok.  Maternal Data    Feeding    LATCH Score                    Lactation Tools Discussed/Used    Interventions    Discharge    Consult Status      [name] 06/05/2023, 5:17 AM

## 2023-06-05 NOTE — Lactation Note (Signed)
This note was copied from a baby's chart. Lactation Consultation Note  Patient Name: Vicki Cline GNFAO'Z Date: 06/05/2023 Age:26 hours  Attempted to see mom again. Everyone sleeping.   Maternal Data    Feeding    LATCH Score                    Lactation Tools Discussed/Used    Interventions    Discharge    Consult Status      [name] 06/05/2023, 4:12 AM

## 2023-06-05 NOTE — Progress Notes (Signed)
Post Partum Day 1 Subjective: no complaints, up ad lib, voiding, and tolerating PO  Objective: Blood pressure 115/84, pulse 86, temperature 97.7 F (36.5 C), temperature source Oral, resp. rate 16, height 5\' 5"  (1.651 m), weight 90.1 kg, last menstrual period 08/31/2022, SpO2 100 %, unknown if currently breastfeeding.  Physical Exam:  General: alert, cooperative, and no distress Lochia: appropriate Uterine Fundus: firm Incision: n/a DVT Evaluation: No evidence of DVT seen on physical exam. Negative Homan's sign. No cords or calf tenderness. No significant calf/ankle edema.  Recent Labs    06/03/23 0915  HGB 11.9*  HCT 36.6    Assessment/Plan: Plan for discharge tomorrow, Breastfeeding, Lactation consult, and Contraception Depo   LOS: 2 days   Donette Larry, CNM 06/05/2023, 11:31 AM

## 2023-06-05 NOTE — Anesthesia Postprocedure Evaluation (Signed)
Anesthesia Post Note  Patient: Vicki Cline  Procedure(s) Performed: AN AD HOC LABOR EPIDURAL     Patient location during evaluation: Mother Baby Anesthesia Type: Epidural Level of consciousness: awake, oriented and awake and alert Pain management: pain level controlled Vital Signs Assessment: post-procedure vital signs reviewed and stable Respiratory status: spontaneous breathing, respiratory function stable and nonlabored ventilation Cardiovascular status: stable Postop Assessment: no headache, adequate PO intake, able to ambulate, patient able to bend at knees and no apparent nausea or vomiting Anesthetic complications: no   No notable events documented.  Last Vitals:  Vitals:   06/04/23 2340 06/05/23 0510  BP: 122/81 115/84  Pulse: 81 86  Resp: 16 16  Temp: 36.7 C 36.5 C  SpO2:      Last Pain:  Vitals:   06/05/23 0510  TempSrc: Oral  PainSc: 5    Pain Goal:                   Vicki Cline

## 2023-06-06 ENCOUNTER — Encounter: Payer: Self-pay | Admitting: Certified Nurse Midwife

## 2023-06-06 ENCOUNTER — Other Ambulatory Visit (HOSPITAL_COMMUNITY): Payer: Self-pay

## 2023-06-06 MED ORDER — SENNOSIDES-DOCUSATE SODIUM 8.6-50 MG PO TABS
2.0000 | ORAL_TABLET | ORAL | 0 refills | Status: DC
  Filled 2023-06-06: qty 30, 15d supply, fill #0

## 2023-06-06 MED ORDER — ACETAMINOPHEN 325 MG PO TABS
650.0000 mg | ORAL_TABLET | ORAL | 0 refills | Status: DC | PRN
  Filled 2023-06-06: qty 100, 9d supply, fill #0

## 2023-06-06 MED ORDER — IBUPROFEN 600 MG PO TABS
600.0000 mg | ORAL_TABLET | Freq: Four times a day (QID) | ORAL | 0 refills | Status: DC
  Filled 2023-06-06: qty 30, 8d supply, fill #0

## 2023-06-06 MED ORDER — POLYETHYLENE GLYCOL 3350 17 GM/SCOOP PO POWD
17.0000 g | Freq: Every day | ORAL | 0 refills | Status: DC
  Filled 2023-06-06: qty 476, 28d supply, fill #0

## 2023-06-06 NOTE — Lactation Note (Signed)
This note was copied from a baby's chart. Lactation Consultation Note  Patient Name: Vicki Cline ZOXWR'U Date: 06/06/2023 Age:26 hours Reason for consult: Follow-up assessment  Mother states baby has started to latch.  Encouraged offering breast before formula to help establish her milk supply.  Suggest pumping after giving formula.  Reviewed engorgement care and monitoring voids/stools.  Maternal Data Has patient been taught Hand Expression?: Yes  Feeding Mother's Current Feeding Choice: Breast Milk and Formula  Lactation Tools Discussed/Used Tools: Pump  Interventions Interventions: Education;DEBP  Discharge Discharge Education: Engorgement and breast care;Warning signs for feeding baby Pump: Personal  Consult Status Consult Status: Complete Date: 06/06/23    Dahlia Byes [facility] 06/06/2023, 11:12 AM

## 2023-06-07 ENCOUNTER — Encounter: Payer: BC Managed Care – PPO | Admitting: Certified Nurse Midwife

## 2023-06-13 ENCOUNTER — Inpatient Hospital Stay (HOSPITAL_COMMUNITY)
Admission: RE | Admit: 2023-06-13 | Payer: BC Managed Care – PPO | Source: Home / Self Care | Admitting: Obstetrics and Gynecology

## 2023-06-13 ENCOUNTER — Inpatient Hospital Stay (HOSPITAL_COMMUNITY): Payer: BC Managed Care – PPO

## 2023-06-21 ENCOUNTER — Telehealth (HOSPITAL_COMMUNITY): Payer: Self-pay | Admitting: *Deleted

## 2023-06-21 NOTE — Telephone Encounter (Signed)
06/21/2023  Name: Vicki Cline MRN: [id number] DOB: [birth year]  Reason for Call:  Transition of Care Hospital Discharge Call  Contact Status:  Patient at pediatrician's office when RN called.  Patient requests call back.    Language assistant needed: Interpreter Mode: Interpreter Not Needed        Follow-Up Questions:    [name] Postnatal Depression Scale:  In the Past 7 Days:    PHQ2-9 Depression Scale:     Discharge Follow-up:    Post-discharge interventions: NA  Salena Saner, RN  06/21/2023 15:04

## 2023-06-26 ENCOUNTER — Telehealth (HOSPITAL_COMMUNITY): Payer: Self-pay | Admitting: *Deleted

## 2023-06-26 NOTE — Telephone Encounter (Signed)
06/26/2023  Name: Vicki Cline MRN: [id number] DOB: Sep 19, 1996  Reason for Call:  Transition of Care Hospital Discharge Call  Contact Status: Patient Contact Status: Complete  Language assistant needed:          Follow-Up Questions: Do You Have Any Concerns About Your Health As You Heal From Delivery?: No Do You Have Any Concerns About Your Infants Health?: No  Edinburgh Postnatal Depression Scale:  In the Past 7 Days: I have been able to laugh and see the funny side of things.: Not quite so much now I have looked forward with enjoyment to things.: Rather less than I used to I have blamed myself unnecessarily when things went wrong.: Not very often I have been anxious or worried for no good reason.: Yes, sometimes I have felt scared or panicky for no good reason.: No, not at all Things have been getting on top of me.: No, most of the time I have coped quite well I have been so unhappy that I have had difficulty sleeping.: Not at all I have felt sad or miserable.: Not very often I have been so unhappy that I have been crying.: Only occasionally The thought of harming myself has occurred to me.: Never [name] Postnatal Depression Scale Total: 8 RN encouraged patient to reach out to her OB if she doesn't start to feel more like her normal self over the next week. RN also informed patient about maternal mental health resources. Patient agreed for RN to email list of resources. PHQ2-9 Depression Scale:     Discharge Follow-up: Edinburgh score requires follow up?: No Patient was advised of the following resources:: Breastfeeding Support Group, Support Group  Post-discharge interventions: Reviewed Newborn Safe Sleep Practices Maternal Mental Health Resources provided  Signature Leota Jacobsen, 06/26/23, [phone]

## 2023-07-14 ENCOUNTER — Other Ambulatory Visit: Payer: Self-pay

## 2023-07-14 ENCOUNTER — Encounter: Payer: Self-pay | Admitting: Certified Nurse Midwife

## 2023-07-14 ENCOUNTER — Ambulatory Visit (INDEPENDENT_AMBULATORY_CARE_PROVIDER_SITE_OTHER): Payer: BC Managed Care – PPO | Admitting: Certified Nurse Midwife

## 2023-07-14 DIAGNOSIS — R309 Painful micturition, unspecified: Secondary | ICD-10-CM

## 2023-07-14 DIAGNOSIS — Z3042 Encounter for surveillance of injectable contraceptive: Secondary | ICD-10-CM

## 2023-07-14 MED ORDER — MEDROXYPROGESTERONE ACETATE 150 MG/ML IM SUSY
150.0000 mg | PREFILLED_SYRINGE | Freq: Once | INTRAMUSCULAR | Status: AC
Start: 2023-07-14 — End: 2023-07-14
  Administered 2023-07-14: 150 mg via INTRAMUSCULAR

## 2023-07-14 NOTE — Progress Notes (Signed)
Post Partum Visit Note  Vicki Cline is a 27 y.o. G70P1011 female who presents for a postpartum visit. She is  5.5  weeks postpartum following a normal spontaneous vaginal delivery.  I have fully reviewed the prenatal and intrapartum course. The delivery was at 39.5 gestational weeks.  Anesthesia: epidural. Postpartum course has been uncomplicated. Baby is doing well. Baby is feeding by breast. Bleeding red. Bowel function is normal. Bladder function is normal. Patient is not sexually active. Contraception method is Depo-Provera injections. Postpartum depression screening: negative. She also noted that over the last week she has been having increased pressure and pain with urination.    The pregnancy intention screening data noted above was reviewed. Potential methods of contraception were discussed. The patient elected to proceed with Depo-Injection   Edinburgh Postnatal Depression Scale - 07/14/23 1102       Edinburgh Postnatal Depression Scale:  In the Past 7 Days   I have been able to laugh and see the funny side of things. 0    I have looked forward with enjoyment to things. 0    I have blamed myself unnecessarily when things went wrong. 0    I have been anxious or worried for no good reason. 0    I have felt scared or panicky for no good reason. 0    Things have been getting on top of me. 0    I have been so unhappy that I have had difficulty sleeping. 0    I have felt sad or miserable. 0    I have been so unhappy that I have been crying. 0    The thought of harming myself has occurred to me. 0    Edinburgh Postnatal Depression Scale Total 0             Health Maintenance Due  Topic Date Due   HPV VACCINES (1 - 3-dose series) Never done   DTaP/Tdap/Td (2 - Td or Tdap) 06/09/2021   COVID-19 Vaccine (4 - 2023-24 season) 08/19/2022    The following portions of the patient's history were reviewed and updated as appropriate: allergies, current medications,  past family history, past medical history, past social history, past surgical history, and problem list.  Review of Systems Pertinent items are noted in HPI.  Objective:  BP 114/71   Pulse 94   Wt 174 lb (78.9 kg)   Breastfeeding Yes   BMI 28.96 kg/m    General:  alert, cooperative, and appears stated age   Breasts:  Patient actively breast feeding exam deferred.   Lungs: clear to auscultation bilaterally  Heart:  regular rate and rhythm, S1, S2 normal, no murmur, click, rub or gallop  Abdomen: soft, non-tender; bowel sounds normal; no masses,  no organomegaly   Wound N/A   GU exam:  not indicated       Assessment:    1. Postpartum care and examination  - Over all doing well. Patient reports she was having some Baby Blues in the first 2 weeks but feels like she has gotten better in the last 3 weeks.  - She states that bleeding stopped 4 weeks into PP and believes she period is starting because she is now bleeding again.    2. Painful Urination  - UA collected today to rule out UTI    Normal postpartum exam.   Plan:   Essential components of care per ACOG recommendations:  1.  Mood and well being: Patient with negative depression  screening today. Reviewed local resources for support.  - Patient tobacco use? No.   - hx of drug use? No.    2. Infant care and feeding:  -Patient currently breastmilk feeding? Yes. Reviewed importance of draining breast regularly to support lactation.  -Social determinants of health (SDOH) reviewed in EPIC. No concerns.   3. Sexuality, contraception and birth spacing - Patient does not want a pregnancy in the next year.  Desired family size is 1 children.  - Reviewed reproductive life planning. Reviewed contraceptive methods based on pt preferences and effectiveness.  Patient desired Hormonal Injection today.   - Discussed birth spacing of 18 months  4. Sleep and fatigue -Encouraged family/partner/community support of 4 hrs of  uninterrupted sleep to help with mood and fatigue  5. Physical Recovery  - Discussed patients delivery and complications. She describes her labor as good. She wish that she was able to get into the waterbirth tub.  - Patient had a Vaginal, no problems at delivery. Patient did not have a laceration. Perineal healing reviewed. Patient expressed understanding - Patient has urinary incontinence? No. - Patient is safe to resume physical and sexual activity as she desires.   6.  Health Maintenance - HM due items addressed Yes - Last pap smear  Diagnosis  Date Value Ref Range Status  11/05/2020 (A)  Final   - Atypical squamous cells of undetermined significance (ASC-US)   Pap smear not done at today's visit at patient request. Patient will follow up in 6 weeks for PAP exam.   -Breast Cancer screening indicated? No.   7. Chronic Disease/Pregnancy Condition follow up: None  - PCP follow up  Shantonette Danella Deis) Suzie Portela, MSN, CNM  Center for [facility]  07/14/2023 6:16 PM

## 2023-07-14 NOTE — Progress Notes (Signed)
Patient would like to come back for pap smear due to her bleeding today.  Iverson Alamin here for Depo-Provera Injection. Injection administered without complication. Patient will return in 3 months for next injection between Oct 11 and Oct 25.   Guy Begin, CMA 07/14/2023  12:34 PM

## 2023-09-29 ENCOUNTER — Ambulatory Visit: Payer: BC Managed Care – PPO

## 2023-10-03 ENCOUNTER — Ambulatory Visit (INDEPENDENT_AMBULATORY_CARE_PROVIDER_SITE_OTHER): Payer: BC Managed Care – PPO

## 2023-10-03 ENCOUNTER — Other Ambulatory Visit: Payer: Self-pay

## 2023-10-03 VITALS — BP 114/67 | HR 87 | Ht 65.0 in | Wt 178.0 lb

## 2023-10-03 DIAGNOSIS — Z3042 Encounter for surveillance of injectable contraceptive: Secondary | ICD-10-CM | POA: Diagnosis not present

## 2023-10-03 MED ORDER — MEDROXYPROGESTERONE ACETATE 150 MG/ML IM SUSY
150.0000 mg | PREFILLED_SYRINGE | Freq: Once | INTRAMUSCULAR | Status: AC
Start: 2023-10-03 — End: 2023-10-03
  Administered 2023-10-03: 150 mg via INTRAMUSCULAR

## 2023-10-03 NOTE — Progress Notes (Signed)
Vicki Cline here for Depo-Provera Injection. Injection administered without complication. Patient will return in 3 months for next injection between 12/19/23 and 01/02/24. Next annual visit due 07/14/23. Pap smear is scheduled for 11/02/23.   Quintella Reichert, RN 10/03/2023  2:26 PM

## 2023-11-02 ENCOUNTER — Ambulatory Visit: Payer: BC Managed Care – PPO | Admitting: Certified Nurse Midwife

## 2023-11-09 ENCOUNTER — Other Ambulatory Visit: Payer: Self-pay

## 2023-11-09 ENCOUNTER — Encounter: Payer: Self-pay | Admitting: Certified Nurse Midwife

## 2023-11-09 ENCOUNTER — Other Ambulatory Visit (HOSPITAL_COMMUNITY)
Admission: RE | Admit: 2023-11-09 | Discharge: 2023-11-09 | Disposition: A | Discharge status: Home / Self Care | Payer: Medicaid Other | Source: Ambulatory Visit | Attending: Certified Nurse Midwife | Admitting: Certified Nurse Midwife

## 2023-11-09 ENCOUNTER — Ambulatory Visit: Payer: Medicaid Other | Admitting: Certified Nurse Midwife

## 2023-11-09 VITALS — BP 112/65 | HR 90 | Ht 65.5 in | Wt 177.0 lb

## 2023-11-09 DIAGNOSIS — Z01419 Encounter for gynecological examination (general) (routine) without abnormal findings: Secondary | ICD-10-CM | POA: Diagnosis not present

## 2023-11-09 DIAGNOSIS — Z113 Encounter for screening for infections with a predominantly sexual mode of transmission: Secondary | ICD-10-CM | POA: Insufficient documentation

## 2023-11-09 DIAGNOSIS — Z124 Encounter for screening for malignant neoplasm of cervix: Secondary | ICD-10-CM | POA: Insufficient documentation

## 2023-11-09 NOTE — Progress Notes (Signed)
GYNECOLOGY OFFICE VISIT NOTE  History:   Vicki Cline is a 27 y.o. G2P1011 here today for routine annual visit with pap smear. She denies any abnormal vaginal discharge, bleeding, pelvic pain or other concerns.    Past Medical History:  Diagnosis Date   Medical history non-contributory    Supervision of low-risk pregnancy 11/09/2022              Nursing Staff    Provider      Office Location    [facility]    Dating     06/06/2023, by Ultrasound      [facility] Model    [x]  Traditional  [ ]  Centering  [ ]  Mom-Baby Dyad    Anatomy US     Nml      Language     English                Flu Vaccine          Genetic/Carrier Screen     NIPS:   Nml female  AFP:  Neg  Horizon: Nml      TDaP Vaccine     Declined 05/05/23     Hgb A1C or   GTT    Past Surgical History:  Procedure Laterality Date   NO PAST SURGERIES      The following portions of the patient's history were reviewed and updated as appropriate: allergies, current medications, past family history, past medical history, past social history, past surgical history and problem list.   Health Maintenance:  ASC-US pap and negative HRHPV on 11/05/2020.    Review of Systems:  Pertinent items noted in HPI and remainder of comprehensive ROS otherwise negative.  Physical Exam:  BP 112/65   Pulse 90   Ht 5' 5.5" (1.664 m)   Wt 177 lb (80.3 kg)   BMI 29.01 kg/m  CONSTITUTIONAL: Well-developed, well-nourished female in no acute distress.  HEENT:  Normocephalic, atraumatic. External right and left ear normal. No scleral icterus.  NECK: Normal range of motion, supple, no masses noted on observation SKIN: No rash noted. Not diaphoretic. No erythema. No pallor. MUSCULOSKELETAL: Normal range of motion. No edema noted. NEUROLOGIC: Alert and oriented to person, place, and time. Normal muscle tone coordination. No cranial nerve deficit noted. PSYCHIATRIC: Normal mood and affect. Normal behavior. Normal judgment and thought  content. CARDIOVASCULAR: Normal heart rate noted RESPIRATORY: Effort and breath sounds normal, no problems with respiration noted ABDOMEN: No masses noted. No other overt distention noted.   PELVIC: Normal appearing external genitalia; normal urethral meatus; normal appearing vaginal mucosa and cervix.  No abnormal discharge noted.  Normal uterine size, no other palpable masses, no uterine or adnexal tenderness. Performed in the presence of a chaperone  Labs and Imaging No results found for this or any previous visit (from the past 168 hour(s)). No results found.    Assessment and Plan:    1. Encounter for annual routine gynecological examination - Patient overall doing well.  - Has a 37 month  old and reports starting a new job at [employer].   2. Pap smear for cervical cancer screening - Pap collected today.  - Cytology - PAP( [place])  3. Screening examination for STD (sexually transmitted disease) - STD screening colleted today per patient request.  - Cervicovaginal ancillary only( [place])   Routine preventative health maintenance measures emphasized. Please refer to After Visit Summary for other counseling recommendations.   Return in about  1 year (around 11/08/2024) for [place].    I spent 30 minutes dedicated to the care of this patient including pre-visit review of records, face to face time with the patient discussing her conditions and treatments and post visit orders.    Kimberl Vig Danella Deis) Suzie Portela, MSN, CNM  Center for [facility]  11/09/23 5:30 PM

## 2023-11-13 LAB — CERVICOVAGINAL ANCILLARY ONLY
Bacterial Vaginitis (gardnerella): NEGATIVE
Candida Glabrata: NEGATIVE
Candida Vaginitis: POSITIVE — AB
Chlamydia: NEGATIVE
Comment: NEGATIVE
Comment: NEGATIVE
Comment: NEGATIVE
Comment: NEGATIVE
Comment: NEGATIVE
Comment: NORMAL
Neisseria Gonorrhea: NEGATIVE
Trichomonas: NEGATIVE

## 2023-11-13 MED ORDER — TERCONAZOLE 0.4 % VA CREA: 1.0000 | TOPICAL_CREAM | Freq: Every day | VAGINAL | 0 refills | Status: DC

## 2023-11-13 NOTE — Addendum Note (Signed)
Addended by: Carlynn Herald on: 11/13/2023 04:43 PM   Modules accepted: Orders

## 2023-11-15 LAB — CYTOLOGY - PAP
Comment: NEGATIVE
Diagnosis: NEGATIVE
High risk HPV: NEGATIVE

## 2023-12-21 ENCOUNTER — Other Ambulatory Visit: Payer: Self-pay

## 2023-12-21 ENCOUNTER — Ambulatory Visit: Payer: No Typology Code available for payment source

## 2023-12-21 ENCOUNTER — Ambulatory Visit: Payer: BC Managed Care – PPO

## 2023-12-21 VITALS — BP 110/65 | HR 91 | Ht 65.5 in | Wt 178.4 lb

## 2023-12-21 DIAGNOSIS — Z3042 Encounter for surveillance of injectable contraceptive: Secondary | ICD-10-CM | POA: Diagnosis not present

## 2023-12-21 DIAGNOSIS — Z0000 Encounter for general adult medical examination without abnormal findings: Secondary | ICD-10-CM

## 2023-12-21 MED ORDER — MEDROXYPROGESTERONE ACETATE 150 MG/ML IM SUSY
150.0000 mg | PREFILLED_SYRINGE | Freq: Once | INTRAMUSCULAR | Status: AC
  Administered 2023-12-21: 150 mg via INTRAMUSCULAR

## 2023-12-21 NOTE — Progress Notes (Signed)
 Vicki Cline here for Depo-Provera  Injection. Injection administered without complication. Patient will return in 3 months for next injection between March 20 and April 3. Next annual visit due 10/2024.   Quinette Hentges, RN 12/21/2023  1:42 PM

## 2023-12-22 ENCOUNTER — Other Ambulatory Visit (HOSPITAL_COMMUNITY): Payer: Self-pay

## 2024-02-16 ENCOUNTER — Ambulatory Visit (INDEPENDENT_AMBULATORY_CARE_PROVIDER_SITE_OTHER): Payer: No Typology Code available for payment source

## 2024-02-16 ENCOUNTER — Other Ambulatory Visit (HOSPITAL_COMMUNITY)
Admission: RE | Admit: 2024-02-16 | Discharge: 2024-02-16 | Disposition: A | Discharge status: Home / Self Care | Source: Ambulatory Visit | Attending: Family Medicine | Admitting: Family Medicine

## 2024-02-16 ENCOUNTER — Other Ambulatory Visit: Payer: Self-pay

## 2024-02-16 DIAGNOSIS — N898 Other specified noninflammatory disorders of vagina: Secondary | ICD-10-CM

## 2024-02-16 NOTE — Progress Notes (Signed)
 Here today for vaginal swab. Reports symptoms of abnormal vaginal discharge that has not subsided following treatment of yeast infection November 2024. Self swab collected. Would prefer to defer any treatment until after results are received. Pt will be contacted with any abnormal results.   Fleet Contras RN 02/16/24

## 2024-02-18 LAB — CERVICOVAGINAL ANCILLARY ONLY
Bacterial Vaginitis (gardnerella): POSITIVE — AB
Candida Glabrata: NEGATIVE
Candida Vaginitis: NEGATIVE
Chlamydia: NEGATIVE
Comment: NEGATIVE
Comment: NEGATIVE
Comment: NEGATIVE
Comment: NEGATIVE
Comment: NEGATIVE
Comment: NORMAL
Neisseria Gonorrhea: NEGATIVE
Trichomonas: NEGATIVE

## 2024-02-19 ENCOUNTER — Other Ambulatory Visit: Payer: Self-pay | Admitting: Obstetrics and Gynecology

## 2024-02-19 MED ORDER — METRONIDAZOLE 500 MG PO TABS: 500.0000 mg | ORAL_TABLET | Freq: Two times a day (BID) | ORAL | 0 refills | Status: AC

## 2024-03-07 ENCOUNTER — Ambulatory Visit: Payer: No Typology Code available for payment source

## 2024-03-07 ENCOUNTER — Other Ambulatory Visit: Payer: Self-pay

## 2024-03-07 VITALS — BP 118/71 | HR 87 | Wt 175.5 lb

## 2024-03-07 DIAGNOSIS — Z3042 Encounter for surveillance of injectable contraceptive: Secondary | ICD-10-CM

## 2024-03-07 MED ORDER — MEDROXYPROGESTERONE ACETATE 150 MG/ML IM SUSY
150.0000 mg | PREFILLED_SYRINGE | Freq: Once | INTRAMUSCULAR | Status: AC
Start: 2024-03-07 — End: 2024-03-07
  Administered 2024-03-07: 150 mg via INTRAMUSCULAR

## 2024-03-07 NOTE — Progress Notes (Signed)
 Vicki Cline here for Depo-Provera Injection. Injection administered without complication. Patient will return in 3 months for next injection between 05/23/24 and 06/06/24. Next annual visit due November 2025.   Marjo Bicker, RN 03/07/2024  2:17 PM

## 2024-05-24 ENCOUNTER — Other Ambulatory Visit (HOSPITAL_COMMUNITY)
Admission: RE | Admit: 2024-05-24 | Discharge: 2024-05-24 | Disposition: A | Discharge status: Home / Self Care | Source: Ambulatory Visit | Attending: Family Medicine | Admitting: Family Medicine

## 2024-05-24 ENCOUNTER — Other Ambulatory Visit: Payer: Self-pay

## 2024-05-24 ENCOUNTER — Ambulatory Visit

## 2024-05-24 VITALS — BP 104/63 | HR 83 | Ht 65.5 in | Wt 174.7 lb

## 2024-05-24 DIAGNOSIS — N898 Other specified noninflammatory disorders of vagina: Secondary | ICD-10-CM

## 2024-05-24 DIAGNOSIS — Z3042 Encounter for surveillance of injectable contraceptive: Secondary | ICD-10-CM

## 2024-05-24 MED ORDER — MEDROXYPROGESTERONE ACETATE 150 MG/ML IM SUSY
150.0000 mg | PREFILLED_SYRINGE | Freq: Once | INTRAMUSCULAR | Status: AC
  Administered 2024-05-24: 150 mg via INTRAMUSCULAR

## 2024-05-24 NOTE — Progress Notes (Signed)
 Vicki Cline here for Depo-Provera  Injection. Injection administered without complication. Patient will return in 3 months for next injection between Aug 22 and Sept 5. Next annual visit due 10/2024.   Pt reports that she has vaginal discharge with mild odor.  Pt explained how to obtain self swab and that we will call with abnormal results.    Markeesha Char, RN 05/24/2024  10:42 AM

## 2024-05-27 LAB — CERVICOVAGINAL ANCILLARY ONLY
Bacterial Vaginitis (gardnerella): NEGATIVE
Candida Glabrata: NEGATIVE
Candida Vaginitis: NEGATIVE
Chlamydia: NEGATIVE
Comment: NEGATIVE
Comment: NEGATIVE
Comment: NEGATIVE
Comment: NEGATIVE
Comment: NEGATIVE
Comment: NORMAL
Neisseria Gonorrhea: NEGATIVE
Trichomonas: NEGATIVE

## 2024-08-12 ENCOUNTER — Other Ambulatory Visit: Payer: Self-pay

## 2024-08-12 ENCOUNTER — Ambulatory Visit

## 2024-08-12 VITALS — BP 113/78 | HR 88 | Ht 65.5 in | Wt 181.1 lb

## 2024-08-12 DIAGNOSIS — Z3042 Encounter for surveillance of injectable contraceptive: Secondary | ICD-10-CM | POA: Diagnosis not present

## 2024-08-12 MED ORDER — MEDROXYPROGESTERONE ACETATE 150 MG/ML IM SUSY
150.0000 mg | PREFILLED_SYRINGE | Freq: Once | INTRAMUSCULAR | Status: AC
  Administered 2024-08-12: 150 mg via INTRAMUSCULAR

## 2024-08-12 NOTE — Progress Notes (Signed)
 Vicki Cline here for Depo-Provera  Injection. Injection administered without complication. Patient will return in 3 months for next injection between November 10 and November 24. Next annual visit due 10/2024.   Jozef Eisenbeis, RN 08/12/2024  9:31 AM

## 2024-08-31 ENCOUNTER — Ambulatory Visit

## 2024-09-02 NOTE — Telephone Encounter (Signed)
 Copied from CRM #[id number]. Topic: Schedule Appointment - Schedule Patient >> Sep 02, 2024 10:06 AM Olen B wrote: Vicki Cline, Vicki Cline is calling for clinical concerns (Ask: What symptoms are you calling about today, AND how long have you had these symptoms? Must Review HPKW list for symptoms) Document Name of Triage Nurse/BH Rep taking the call when applicable)   Include all details related to the request(s) below: Pt is asking for a call back after missing a call earlier today     Confirm and type the Best Contact Number below:  Patient/caller contact number:     [phone]         [] Home  [x] Mobile  [] Work [] Other   [x] Okay to leave a voicemail   Medication List: No current outpatient medications on file.     Medication Request/Refills: Pharmacy Information (if applicable)   [] Not Applicable       []  Pharmacy listed  Send Medication Request to:                                                 [] Pharmacy not listed (added to pharmacy list in Epic) Send Medication Request to:      Listed Pharmacies: No Pharmacies Listed

## 2024-10-28 ENCOUNTER — Ambulatory Visit

## 2024-12-04 ENCOUNTER — Ambulatory Visit: Admitting: Certified Nurse Midwife

## 2025-01-15 ENCOUNTER — Ambulatory Visit: Admitting: Certified Nurse Midwife

## 2025-01-15 ENCOUNTER — Other Ambulatory Visit (HOSPITAL_COMMUNITY)
Admission: RE | Admit: 2025-01-15 | Discharge: 2025-01-15 | Disposition: A | Source: Ambulatory Visit | Attending: Certified Nurse Midwife | Admitting: Certified Nurse Midwife

## 2025-01-15 ENCOUNTER — Other Ambulatory Visit: Payer: Self-pay

## 2025-01-15 ENCOUNTER — Encounter: Payer: Self-pay | Admitting: Certified Nurse Midwife

## 2025-01-15 VITALS — BP 108/72 | HR 90 | Wt 186.4 lb

## 2025-01-15 DIAGNOSIS — Z113 Encounter for screening for infections with a predominantly sexual mode of transmission: Secondary | ICD-10-CM | POA: Insufficient documentation

## 2025-01-15 DIAGNOSIS — N760 Acute vaginitis: Secondary | ICD-10-CM

## 2025-01-15 DIAGNOSIS — Z01419 Encounter for gynecological examination (general) (routine) without abnormal findings: Secondary | ICD-10-CM

## 2025-01-15 NOTE — Progress Notes (Signed)
 "  ANNUAL EXAM Patient name: Vicki Cline MRN [id number]  Date of birth: October 03, 1996 Chief Complaint:   Annual Exam  History of Present Illness:   Vicki Cline is a 29 y.o. G79P1011 African-American female being seen today for a routine annual exam.  Current complaints: Recurrent BV and wants STI testing.  No LMP recorded. Patient has had an injection.  The pregnancy intention screening data noted above was reviewed. Potential methods of contraception were discussed. The patient elected to proceed with Abstinence.   Last pap 11/09/23. Results were: NILM w/ HRHPV negative. H/O abnormal pap: no Last mammogram: Never (age). Results were: N/A. Family h/o breast cancer: no Last colonoscopy: Never (age). Results were: N/A. Family h/o colorectal cancer: no     11/09/2023    4:16 PM 11/24/2022   10:12 AM 11/05/2020   11:57 AM  Depression screen PHQ 2/9  Decreased Interest 1 0 0  Down, Depressed, Hopeless 1 1 0  PHQ - 2 Score 2 1 0  Altered sleeping 0 1 1  Tired, decreased energy 0 2 0  Change in appetite 0 1 0  Feeling bad or failure about yourself  0 0 0  Trouble concentrating 0 0 0  Moving slowly or fidgety/restless 0 0 0  Suicidal thoughts 0 0 0  PHQ-9 Score 2  5  1       Data saved with a previous flowsheet row definition        11/09/2023    4:16 PM 11/24/2022   10:13 AM 11/05/2020   11:57 AM  GAD 7 : Generalized Anxiety Score  Nervous, Anxious, on Edge 1  1  0   Control/stop worrying 0  1  0   Worry too much - different things 0  1  0   Trouble relaxing 0  1  0   Restless 0  0  0   Easily annoyed or irritable 1  1  0   Afraid - awful might happen 0  0  0   Total GAD 7 Score 2 5 0     Data saved with a previous flowsheet row definition     Review of Systems:   Pertinent items are noted in HPI Denies any headaches, blurred vision, fatigue, shortness of breath, chest pain, abdominal pain, abnormal vaginal  discharge/itching/odor/irritation, problems with periods, bowel movements, urination, or intercourse unless otherwise stated above.  Pertinent History Reviewed:  Reviewed past medical,surgical, social and family history.  Reviewed problem list, medications and allergies.  Physical Assessment:   Vitals:   01/15/25 1600  BP: 108/72  Pulse: 90  Weight: 186 lb 6.4 oz (84.6 kg)   Body mass index is 30.55 kg/m.   Physical Examination:  General appearance - well appearing, and in no distress Mental status - alert, oriented to person, place, and time Psych:  She has a normal mood and affect Skin - warm and dry, normal color, no suspicious lesions noted Chest - effort normal, no problems with respiration noted Heart - normal rate and regular rhythm Neck:  midline trachea, no thyromegaly or nodules Breasts - breasts appear normal, no suspicious masses, no skin or nipple changes or  axillary nodes Abdomen - soft, nontender, nondistended, no masses or organomegaly Pelvic - exam deferred, self-swabs collected Extremities:  No swelling or varicosities noted  Chaperone present for exam  No results found for this or any previous visit (from the past 24 hours).  Assessment & Plan:  1. Encounter for annual routine  gynecological examination (Primary) - Routine preventative health maintenance measures emphasized. - Mammogram: @ 28yo, or sooner if problems - Colonoscopy: @ 28yo, or sooner if problems  2. Screening examination for STD (sexually transmitted disease) - Cervicovaginal ancillary only( [place]) - RPR+HBsAg+HCVAb+...  3. Recurrent vaginitis - Will test for myco/ureaplasma - Reviewed ways to keep vaginal flora intact/improved  Orders Placed This Encounter  Procedures   Genital Mycoplasmas NAA, Swab   RPR+HBsAg+HCVAb+...    Meds: No orders of the defined types were placed in this encounter.   Follow-up: Return in about 1 year (around 01/15/2026), or if symptoms worsen or  fail to improve, for ANN.  Cornell JONELLE Finder, CNM 01/21/2025 7:05 PM "

## 2025-01-16 LAB — RPR+HBSAG+HCVAB+...
HIV Screen 4th Generation wRfx: NONREACTIVE
Hep C Virus Ab: NONREACTIVE
Hepatitis B Surface Ag: NEGATIVE
RPR Ser Ql: NONREACTIVE

## 2025-01-17 LAB — CERVICOVAGINAL ANCILLARY ONLY
Bacterial Vaginitis (gardnerella): NEGATIVE
Candida Glabrata: NEGATIVE
Candida Vaginitis: NEGATIVE
Chlamydia: NEGATIVE
Comment: NEGATIVE
Comment: NEGATIVE
Comment: NEGATIVE
Comment: NEGATIVE
Comment: NEGATIVE
Comment: NORMAL
Neisseria Gonorrhea: NEGATIVE
Trichomonas: NEGATIVE

## 2025-01-20 LAB — GENITAL MYCOPLASMAS NAA, SWAB
Mycoplasma genitalium NAA: NEGATIVE
Mycoplasma hominis NAA: NEGATIVE
Ureaplasma spp NAA: NEGATIVE
